# Patient Record
Sex: Male | Born: 1970 | Race: White | Hispanic: No | Marital: Married | Smoking: Former smoker
Health system: Southern US, Community
[De-identification: ages and names within clinical notes are randomized; demographics above are authoritative.]

## PROBLEM LIST (undated history)

## (undated) DIAGNOSIS — I25810 Atherosclerosis of coronary artery bypass graft(s) without angina pectoris: Secondary | ICD-10-CM

## (undated) DIAGNOSIS — I10 Essential (primary) hypertension: Secondary | ICD-10-CM

## (undated) DIAGNOSIS — E119 Type 2 diabetes mellitus without complications: Secondary | ICD-10-CM

## (undated) DIAGNOSIS — E785 Hyperlipidemia, unspecified: Secondary | ICD-10-CM

## (undated) DIAGNOSIS — R748 Abnormal levels of other serum enzymes: Secondary | ICD-10-CM

## (undated) DIAGNOSIS — K219 Gastro-esophageal reflux disease without esophagitis: Secondary | ICD-10-CM

## (undated) HISTORY — DX: Hyperlipidemia, unspecified: E78.5

## (undated) HISTORY — DX: Type 2 diabetes mellitus without complications: E11.9

## (undated) HISTORY — DX: Gastro-esophageal reflux disease without esophagitis: K21.9

## (undated) HISTORY — DX: Essential (primary) hypertension: I10

## (undated) HISTORY — DX: Atherosclerosis of coronary artery bypass graft(s) without angina pectoris: I25.810

## (undated) HISTORY — DX: Abnormal levels of other serum enzymes: R74.8

## (undated) HISTORY — PX: APPENDECTOMY: SHX54

---

## 2004-08-25 ENCOUNTER — Ambulatory Visit: Payer: Self-pay

## 2005-01-28 ENCOUNTER — Ambulatory Visit: Payer: Self-pay | Admitting: Unknown Physician Specialty

## 2005-02-22 ENCOUNTER — Emergency Department: Payer: Self-pay | Admitting: Emergency Medicine

## 2005-08-23 ENCOUNTER — Ambulatory Visit: Payer: Self-pay

## 2006-02-08 ENCOUNTER — Emergency Department: Payer: Self-pay | Admitting: Emergency Medicine

## 2006-02-09 ENCOUNTER — Other Ambulatory Visit: Payer: Self-pay

## 2007-02-28 ENCOUNTER — Inpatient Hospital Stay: Payer: Self-pay | Admitting: Internal Medicine

## 2007-02-28 ENCOUNTER — Other Ambulatory Visit: Payer: Self-pay

## 2007-07-31 DIAGNOSIS — E785 Hyperlipidemia, unspecified: Secondary | ICD-10-CM | POA: Insufficient documentation

## 2007-07-31 HISTORY — PX: CARDIAC CATHETERIZATION: SHX172

## 2007-11-19 ENCOUNTER — Other Ambulatory Visit: Payer: Self-pay

## 2007-11-19 ENCOUNTER — Emergency Department: Payer: Self-pay | Admitting: Emergency Medicine

## 2008-04-20 ENCOUNTER — Emergency Department: Payer: Self-pay | Admitting: Emergency Medicine

## 2008-12-15 ENCOUNTER — Emergency Department: Payer: Self-pay | Admitting: Emergency Medicine

## 2008-12-16 ENCOUNTER — Ambulatory Visit: Payer: Self-pay | Admitting: Emergency Medicine

## 2009-04-29 ENCOUNTER — Emergency Department: Payer: Self-pay | Admitting: Internal Medicine

## 2009-04-30 ENCOUNTER — Emergency Department: Payer: Self-pay | Admitting: Emergency Medicine

## 2009-09-03 ENCOUNTER — Emergency Department: Payer: Self-pay | Admitting: Emergency Medicine

## 2010-01-14 ENCOUNTER — Emergency Department: Payer: Self-pay | Admitting: Emergency Medicine

## 2011-01-10 ENCOUNTER — Emergency Department: Payer: Self-pay | Admitting: Emergency Medicine

## 2011-11-13 ENCOUNTER — Emergency Department: Payer: Self-pay | Admitting: Emergency Medicine

## 2011-11-13 LAB — COMPREHENSIVE METABOLIC PANEL
Albumin: 3.9 g/dL (ref 3.4–5.0)
Anion Gap: 9 (ref 7–16)
BUN: 10 mg/dL (ref 7–18)
Bilirubin,Total: 0.7 mg/dL (ref 0.2–1.0)
Calcium, Total: 8.8 mg/dL (ref 8.5–10.1)
Creatinine: 0.91 mg/dL (ref 0.60–1.30)
EGFR (Non-African Amer.): >60
Glucose: 286 mg/dL — ABNORMAL HIGH (ref 65–99)
Osmolality: 281 (ref 275–301)

## 2011-11-13 LAB — CBC
HCT: 50.4 % (ref 40.0–52.0)
HGB: 17.4 g/dL (ref 13.0–18.0)
MCHC: 34.5 g/dL (ref 32.0–36.0)
Platelet: 190 10*3/uL (ref 150–440)
RBC: 5.98 10*6/uL — ABNORMAL HIGH (ref 4.40–5.90)
RDW: 13.5 % (ref 11.5–14.5)
WBC: 9.2 10*3/uL (ref 3.8–10.6)

## 2011-11-13 LAB — CK TOTAL AND CKMB (NOT AT ARMC): CK, Total: 110 U/L (ref 35–232)

## 2011-11-13 LAB — TROPONIN I: Troponin-I: <0.02 ng/mL

## 2011-11-13 LAB — LIPASE, BLOOD: Lipase: 64 U/L — ABNORMAL LOW (ref 73–393)

## 2011-11-15 ENCOUNTER — Inpatient Hospital Stay: Payer: Self-pay | Admitting: Surgery

## 2011-11-15 LAB — CBC
HCT: 50.3 % (ref 40.0–52.0)
MCV: 84 fL (ref 80–100)
Platelet: 204 10*3/uL (ref 150–440)
RBC: 6 10*6/uL — ABNORMAL HIGH (ref 4.40–5.90)
RDW: 13.5 % (ref 11.5–14.5)
WBC: 10.7 10*3/uL — ABNORMAL HIGH (ref 3.8–10.6)

## 2011-11-15 LAB — COMPREHENSIVE METABOLIC PANEL
Albumin: 3.6 g/dL (ref 3.4–5.0)
Anion Gap: 9 (ref 7–16)
BUN: 9 mg/dL (ref 7–18)
Calcium, Total: 8.8 mg/dL (ref 8.5–10.1)
Co2: 27 mmol/L (ref 21–32)
Creatinine: 0.98 mg/dL (ref 0.60–1.30)
EGFR (African American): >60
EGFR (Non-African Amer.): >60
Glucose: 252 mg/dL — ABNORMAL HIGH (ref 65–99)
Potassium: 3.8 mmol/L (ref 3.5–5.1)
Sodium: 136 mmol/L (ref 136–145)
Total Protein: 8 g/dL (ref 6.4–8.2)

## 2011-11-15 LAB — URINALYSIS, COMPLETE
Bacteria: NONE SEEN
Glucose,UR: >=500 mg/dL (ref 0–75)
Nitrite: NEGATIVE
Ph: 5 (ref 4.5–8.0)
Squamous Epithelial: NONE SEEN
WBC UR: 1 /HPF (ref 0–5)

## 2011-11-15 LAB — LIPID PANEL
Cholesterol: 185 mg/dL (ref 0–200)
Triglycerides: 185 mg/dL (ref 0–200)

## 2011-11-15 LAB — HEMOGLOBIN A1C: Hemoglobin A1C: 11.7 % — ABNORMAL HIGH (ref 4.2–6.3)

## 2011-11-15 LAB — LIPASE, BLOOD: Lipase: 51 U/L — ABNORMAL LOW (ref 73–393)

## 2011-11-16 LAB — HEPATIC FUNCTION PANEL A (ARMC)
Albumin: 3 g/dL — ABNORMAL LOW (ref 3.4–5.0)
Alkaline Phosphatase: 81 U/L (ref 50–136)
Bilirubin, Direct: 0.7 mg/dL — ABNORMAL HIGH (ref 0.00–0.20)
SGOT(AST): 25 U/L (ref 15–37)
Total Protein: 7.2 g/dL (ref 6.4–8.2)

## 2011-11-16 LAB — CBC WITH DIFFERENTIAL/PLATELET
Basophil %: 0.2 %
Basophil %: 0.2 %
Eosinophil #: 0.1 10*3/uL (ref 0.0–0.7)
Eosinophil #: 0 10*3/uL (ref 0.0–0.7)
Eosinophil %: 1.3 %
Eosinophil %: 0.3 %
HCT: 46.6 % (ref 40.0–52.0)
HCT: 49.3 % (ref 40.0–52.0)
HGB: 16.4 g/dL (ref 13.0–18.0)
Lymphocyte #: 2 10*3/uL (ref 1.0–3.6)
Lymphocyte %: 21.8 %
MCH: 28.8 pg (ref 26.0–34.0)
MCH: 28.5 pg (ref 26.0–34.0)
MCHC: 33.7 g/dL (ref 32.0–36.0)
MCHC: 33.4 g/dL (ref 32.0–36.0)
MCV: 85 fL (ref 80–100)
MCV: 85 fL (ref 80–100)
Monocyte #: 1.1 x10 3/mm — ABNORMAL HIGH (ref 0.2–1.0)
Monocyte #: 1.2 x10 3/mm — ABNORMAL HIGH (ref 0.2–1.0)
Monocyte %: 11.9 %
Monocyte %: 9.7 %
Neutrophil %: 64.8 %
Platelet: 198 10*3/uL (ref 150–440)
Platelet: 189 10*3/uL (ref 150–440)
RBC: 5.46 10*6/uL (ref 4.40–5.90)
RBC: 5.78 10*6/uL (ref 4.40–5.90)
RDW: 13.5 % (ref 11.5–14.5)
RDW: 13.5 % (ref 11.5–14.5)
WBC: 9.3 10*3/uL (ref 3.8–10.6)
WBC: 12.1 10*3/uL — ABNORMAL HIGH (ref 3.8–10.6)

## 2011-11-16 LAB — BASIC METABOLIC PANEL
Anion Gap: 12 (ref 7–16)
Chloride: 101 mmol/L (ref 98–107)
Co2: 24 mmol/L (ref 21–32)
EGFR (African American): >60
EGFR (Non-African Amer.): >60
Glucose: 195 mg/dL — ABNORMAL HIGH (ref 65–99)
Potassium: 3.8 mmol/L (ref 3.5–5.1)
Sodium: 137 mmol/L (ref 136–145)

## 2011-11-16 LAB — PROTIME-INR
INR: 1
Prothrombin Time: 13.9 secs (ref 11.5–14.7)

## 2011-11-17 LAB — BASIC METABOLIC PANEL
Calcium, Total: 7.9 mg/dL — ABNORMAL LOW (ref 8.5–10.1)
Co2: 22 mmol/L (ref 21–32)
Creatinine: 0.92 mg/dL (ref 0.60–1.30)
EGFR (African American): >60
EGFR (Non-African Amer.): >60
Glucose: 298 mg/dL — ABNORMAL HIGH (ref 65–99)
Osmolality: 282 (ref 275–301)
Sodium: 135 mmol/L — ABNORMAL LOW (ref 136–145)

## 2011-11-17 LAB — CBC WITH DIFFERENTIAL/PLATELET
Basophil #: 0 10*3/uL (ref 0.0–0.1)
Eosinophil %: 0 %
HCT: 46.4 % (ref 40.0–52.0)
HGB: 15.5 g/dL (ref 13.0–18.0)
Lymphocyte #: 0.9 10*3/uL — ABNORMAL LOW (ref 1.0–3.6)
Lymphocyte %: 7.7 %
MCH: 28.7 pg (ref 26.0–34.0)
MCHC: 33.5 g/dL (ref 32.0–36.0)
Neutrophil %: 82.6 %

## 2011-11-18 LAB — PATHOLOGY REPORT

## 2011-11-19 LAB — CLOSTRIDIUM DIFFICILE BY PCR

## 2011-11-20 LAB — CBC WITH DIFFERENTIAL/PLATELET
Eosinophil #: 0.4 10*3/uL (ref 0.0–0.7)
Eosinophil %: 6.2 %
Lymphocyte %: 39 %
MCH: 28 pg (ref 26.0–34.0)
MCHC: 32.7 g/dL (ref 32.0–36.0)
MCV: 86 fL (ref 80–100)
Neutrophil %: 46.4 %
Platelet: 222 10*3/uL (ref 150–440)
RDW: 13.2 % (ref 11.5–14.5)
WBC: 5.7 10*3/uL (ref 3.8–10.6)

## 2012-05-09 ENCOUNTER — Emergency Department: Payer: Self-pay | Admitting: Emergency Medicine

## 2012-05-09 LAB — CBC
HCT: 49.7 % (ref 40.0–52.0)
HGB: 17.2 g/dL (ref 13.0–18.0)
RBC: 5.93 10*6/uL — ABNORMAL HIGH (ref 4.40–5.90)
RDW: 13.4 % (ref 11.5–14.5)
WBC: 8.6 10*3/uL (ref 3.8–10.6)

## 2012-05-09 LAB — URINALYSIS, COMPLETE
Bacteria: NONE SEEN
Bilirubin,UR: NEGATIVE
Glucose,UR: >=500 mg/dL (ref 0–75)
Ketone: NEGATIVE
Nitrite: NEGATIVE
Protein: NEGATIVE
RBC,UR: 1 /HPF (ref 0–5)
Specific Gravity: 1.043 (ref 1.003–1.030)
Squamous Epithelial: NONE SEEN

## 2012-05-09 LAB — COMPREHENSIVE METABOLIC PANEL
Albumin: 3.7 g/dL (ref 3.4–5.0)
Alkaline Phosphatase: 98 U/L (ref 50–136)
BUN: 10 mg/dL (ref 7–18)
Bilirubin,Total: 0.6 mg/dL (ref 0.2–1.0)
Calcium, Total: 8.5 mg/dL (ref 8.5–10.1)
Creatinine: 1.1 mg/dL (ref 0.60–1.30)
EGFR (African American): >60
EGFR (Non-African Amer.): >60
Glucose: 372 mg/dL — ABNORMAL HIGH (ref 65–99)
Potassium: 3.8 mmol/L (ref 3.5–5.1)
SGOT(AST): 26 U/L (ref 15–37)
Sodium: 134 mmol/L — ABNORMAL LOW (ref 136–145)
Total Protein: 7.7 g/dL (ref 6.4–8.2)

## 2012-05-09 LAB — LIPASE, BLOOD: Lipase: 67 U/L — ABNORMAL LOW (ref 73–393)

## 2012-05-15 LAB — CULTURE, BLOOD (SINGLE)

## 2014-09-28 ENCOUNTER — Emergency Department
Admit: 2014-09-28 | Disposition: A | Discharge status: Home / Self Care | Payer: Self-pay | Admitting: Emergency Medicine

## 2014-09-28 LAB — BASIC METABOLIC PANEL
ANION GAP: 7 (ref 7–16)
BUN: 11 mg/dL
CHLORIDE: 100 mmol/L — AB
CO2: 26 mmol/L
CREATININE: 0.95 mg/dL
Calcium, Total: 8.7 mg/dL — ABNORMAL LOW
EGFR (African American): >60
EGFR (Non-African Amer.): >60
Glucose: 416 mg/dL — ABNORMAL HIGH
POTASSIUM: 3.8 mmol/L
Sodium: 133 mmol/L — ABNORMAL LOW

## 2014-09-28 LAB — TROPONIN I

## 2014-09-28 LAB — D-DIMER(ARMC): D-DIMER: 129 ng/mL

## 2014-09-28 LAB — CBC
HCT: 50.6 % (ref 40.0–52.0)
HGB: 17.6 g/dL (ref 13.0–18.0)
MCH: 29.2 pg (ref 26.0–34.0)
MCHC: 34.8 g/dL (ref 32.0–36.0)
MCV: 84 fL (ref 80–100)
Platelet: 174 10*3/uL (ref 150–440)
RBC: 6.02 10*6/uL — ABNORMAL HIGH (ref 4.40–5.90)
RDW: 13.4 % (ref 11.5–14.5)
WBC: 8.2 10*3/uL (ref 3.8–10.6)

## 2014-10-15 NOTE — Consult Note (Signed)
PATIENT NAME:  Scott [name], Scott [name] MR#:  [id number] DATE OF BIRTH:  30-Jun-1970  DATE OF CONSULTATION:  05/09/2012  REFERRING PHYSICIAN:   CONSULTING PHYSICIAN:  Quentin Ore III, MD  PRIMARY CARE PHYSICIAN: None.    CHIEF COMPLAINT: Abdominal pain, nausea, vomiting.   BRIEF HISTORY: Scott [name] is a 44 year old gentleman seen in the Emergency Room with a two week history of abdominal pain. The pain worsened yesterday with increasing abdominal discomfort primarily in the right lower quadrant. He presented to the Emergency Room tonight for further evaluation. His history is complicated by a similar episode in May of 2013 when he was admitted with similar symptoms. At that time he had had symptoms for approximately two months and had been seen in the Emergency Room once with no significant abnormalities demonstrated but the pain increased and he presented back to the Emergency Room. He underwent laparoscopy which demonstrated infarcted omentum and some jejunal thickening. He underwent an incidental appendectomy and a partial omentectomy performed by Dr. Anda Kraft. He recovered uneventfully and has had no problems in the interim. Currently his CT scan did not demonstrate any significant inflammatory stranding but he did have a temperature of 102. Laboratory values revealed no elevation in his white blood cell count. The surgical service was consulted since his problems appeared similar to his previous abdominal complaints.   PAST MEDICAL HISTORY: 1. Hypertension. 2. Diabetes. 3. Hypercholesterolemia.   PAST SURGICAL HISTORY: He has not had any other surgery than the laparotomy mentioned above. He's recently restarted his medications as he has regained insurance.   MEDICATIONS:  1. Glipizide 5 mg p.o. daily.  2. Hydrochlorothiazide/lisinopril 12.5/20 mg p.o. daily.  3. Metformin 500 mg b.i.d.  4. Promethazine 25 mg p.o. q.6 hours p.r.n.   ALLERGIES: He is allergic to Biaxin, penicillin, and  prednisone.   REVIEW OF SYSTEMS: Unremarkable as outlined with the patient. 10 point review of systems performed without any evidence of abnormality other than noted above.   PHYSICAL EXAMINATION:   GENERAL: Temperature 101, has been as high as 102.9 while in the Emergency Room, pulse rate 100 and regular, blood pressure 149/70. Pain is now 0 and his respiratory rate is normal.   HEENT: No scleral icterus. No pupillary abnormalities. No facial deformity.   NECK: Supple without adenopathy and he has no tenderness. Midline trachea.   CHEST: Clear with no adventitious sounds. Normal pulmonary excursion.   CARDIAC: No murmurs or gallops to my ear. He seems to be in normal sinus rhythm.   ABDOMEN: Soft, mildly tender in the right lower quadrant with some guarding and some mild rebound. He does not have any masses or hernias noted. He has active bowel sounds without evidence of rushes or tinkling. He does not appear to have an acute abdomen.   LOWER EXTREMITIES: No edema. Good distal pulses. Full range of motion.   PSYCHIATRIC: Normal affect and normal orientation.   ASSESSMENT AND PLAN: I have independently reviewed his CT scan. He did not have any significant abnormalities noted. I am not sure what is going on in this patient's presentation. With his fever and abdominal pain I am concerned about a possible intraabdominal infection or a surgical problem. He says he feels better and does not want to be admitted this evening. Will start him on some Cleocin and Cipro since he's pen allergic and see if we can get his symptoms under control. Will also put him on Vicodin for pain.      He is strongly encouraged  to follow-up with us within the next few days if his symptoms do not improve. He is in agreement with this plan and does not want to consider admission at the present time.   ____________________________ Quentin Orealph L. Ely III, MD rle:drc D: 05/09/2012 19:49:32 ET T: 05/10/2012 08:55:27  ET JOB#: [id number]  cc: Quentin Orealph L. Ely III, MD, <Dictator> Quentin OreALPH L ELY MD ELECTRONICALLY SIGNED 05/11/2012 22:30

## 2014-10-20 NOTE — Consult Note (Signed)
PATIENT NAME:  Scott [name], Scott [name] MR#:  [id number] DATE OF BIRTH:  [birth year]  DATE OF CONSULTATION:  11/15/2011  REFERRING PHYSICIAN:   CONSULTING PHYSICIAN:  Pebbles Zeiders A. Egbert GaribaldiBird, MD  REASON FOR CONSULTATION: Right upper quadrant abdominal pain and enteritis.   HISTORY OF PRESENT ILLNESS:  This is a 44 year old white male with a history of diabetes, hypertension, and hyperlipidemia who has been noncompliant with his medications for at least 4 to 5 months. He presents to the Emergency Room earlier today with a two-week history of epigastric abdominal pain, reflux, and pain in his right upper quadrant which has become much worse. He states that he has reflux on a daily basis and takes TUMS and Pepto-Bismol on a near-daily basis. He also admits to occasional ibuprofen use. He was admitted to the medical service. CT scan was obtained after an ultrasound obtained recently was unremarkable for gallstones. On review of the CT scan there are mesenteric inflammatory changes in the right upper quadrant. Gallbladder and liver appear to be normal. No free air is seen. No free fluid. No pneumatosis intestinalis. Both surgery and gastroenterology have been consulted. Gastroenterology plans on doing an upper endoscopy tomorrow. The patient denies any previous abdominal surgery.   ALLERGIES: Biaxin, penicillin, and prednisone.   MEDICATIONS AT PRESENT:  1. Cipro. 2. Flagyl.  3. Protonix.  4. Sliding scale insulin. 5. Morphine  6. Hydralazine. 7. Dilaudid.   PAST MEDICAL HISTORY:  1. Hypertension.  2. Hyperlipidemia.  3. Reflux disease.  4. Hypertension.   PAST SURGICAL HISTORY: None.   SOCIAL HISTORY:  Currently unemployed, uses smokeless tobacco. Does not drink. Does not smoke. Married, two children.   FAMILY HISTORY: Significant for diabetes, hypertension, coronary artery disease, and lung cancer.   PHYSICAL EXAMINATION:  GENERAL: The patient is in no obvious distress. Family is at the bedside.   VITAL  SIGNS: Temperature 98, pulse 73, respiratory rate 20, blood pressure 150/83.   LUNGS: Clear bilaterally.   HEART: Regular rate and rhythm.   ABDOMEN: Soft, mildly tender in the right upper quadrant. No scars. No hernias.   EXTREMITIES: Warm and well perfused.   LABORATORY, DIAGNOSTIC, AND RADIOLOGICAL DATA: Urinalysis negative. White count 10.7, hemoglobin 17.4, hematocrit 50.3, platelet count 204,000. Liver function tests are normal. Total bilirubin 1.1, hemoglobin A1c 11.7, lipase 51. Electrolytes are unremarkable. Glucose 252. Review of CT scan as described above.   IMPRESSION: Abdominal pain and what looks to be regional mesenteritis of the bowel. Differential diagnoses: Infectious enteritis versus regional enteritis and peptic ulcer disease.   PLAN: I agree with empiric antibiotics, pain control, antiemetics, and GI luminal evaluation. At present there is no surgical indication for laparotomy or laparoscopy.   ____________________________ Redge GainerMark A. Egbert GaribaldiBird, MD mab:bjt D: 11/15/2011 04:54:[id number]:09:22 ET T: 11/16/2011 09:52:23 ET JOB#: [id number]  Guss Farruggia A Isamu Trammel MD ELECTRONICALLY SIGNED 11/19/2011 14:20

## 2014-10-20 NOTE — Discharge Summary (Signed)
PATIENT NAME:  Persinger, Keyston MR#:  [id number] DATE OF BIRTH:  [birth year]  DATE OF ADMISSION:  11/15/2011 DATE OF DISCHARGE:  11/20/2011  PRINCIPLE DIAGNOSIS: Infarcted omentum, intraabdominal adhesions.   OTHER DIAGNOSES:  1. Obesity.  2. Hypertension.  3. Dyslipidemia.  4. Type 2 diabetes mellitus.   PRINCIPLE PROCEDURE PERFORMED DURING THIS ADMISSION:  On 11/16/2011: Diagnostic laparoscopy, exploratory laparotomy, omentectomy, enterolysis, incidental appendectomy.   HOSPITAL COURSE: The patient was admitted to the hospital and ultimately taken to the operating room where he underwent the above-mentioned procedure for the above-mentioned principle diagnosis. Postoperatively he recovered well and was tolerating a regular diet. His pain was controlled with oral pain medication and he was discharged home. He was asked to make an appointment to see me in one week for staple removal and to call the office in the interim for any problems. At the conclusion of his surgery, approximately half of his omentum was remaining and it was enough to cover the small intestine and prevent it from adhering to the midline incision.      ____________________________ Claude MangesWilliam F. Laurin Paulo, MD wfm:bjt D: 11/26/2011 20:35:32 ET T: 11/27/2011 13:29:19 ET JOB#: [id number]  cc: Claude MangesWilliam F. Constantina Laseter, MD, <Dictator> Claude MangesWILLIAM F Grant Swager MD ELECTRONICALLY SIGNED 11/29/2011 21:45

## 2014-10-20 NOTE — Consult Note (Signed)
Brief Consult Note: Diagnosis: abdominal pain, ddx regional enteritis, viral enteritis (less likely), PUD?Marland Kitchen.   Patient was seen by consultant.   Consult note dictated.   Recommend to proceed with surgery or procedure.   Comments: at present no plans for laparoscopy, agree with luminal evaluation in am.  Electronic Signatures: Natale LayBird, Scherry Laverne (MD)  (Signed 20-May-13 20:40)  Authored: Brief Consult Note   Last Updated: 20-May-13 20:40 by Natale LayBird, Mitzy Naron (MD)

## 2014-10-20 NOTE — Consult Note (Signed)
Chief Complaint:   Subjective/Chief Complaint sedated, will arouse, but no interaction.  no apparent discomfort.   VITAL SIGNS/ANCILLARY NOTES: **Vital Signs.:   22-May-13 10:05   Vital Signs Type Routine   Temperature Temperature (F) 97.9   Celsius 36.6   Temperature Source oral   Pulse Pulse 75   Pulse source per Dinamap   Respirations Respirations 14   Systolic BP Systolic BP 967   Diastolic BP (mmHg) Diastolic BP (mmHg) 91   Mean BP 110   BP Source Dinamap   Pulse Ox % Pulse Ox % 93   Pulse Ox Activity Level  At rest   Oxygen Delivery 2L   Brief Assessment:   Cardiac Regular    Respiratory clear BS    Gastrointestinal details normal moderate distension, bowel sounds few/distant.  relatively softer today to palpation     Blood Glucose:  22-May-13 07:26    POCT Blood Glucose 252  Routine Chem:  22-May-13 09:07    Glucose, Serum 298   BUN 16   Creatinine (comp) 0.92   Sodium, Serum 135   Potassium, Serum 4.3   Chloride, Serum 102   CO2, Serum 22   Calcium (Total), Serum 7.9   Osmolality (calc) 282   eGFR (African American) >60   eGFR (Non-African American) >60   Anion Gap 11  Routine Hem:  22-May-13 09:07    WBC (CBC) 12.0   RBC (CBC) 5.42   Hemoglobin (CBC) 15.5   Hematocrit (CBC) 46.4   Platelet Count (CBC) 222   MCV 86   MCH 28.7   MCHC 33.5   RDW 13.2   Neutrophil % 82.6   Lymphocyte % 7.7   Monocyte % 9.5   Eosinophil % 0.0   Basophil % 0.2   Neutrophil # 9.9   Lymphocyte # 0.9   Monocyte # 1.1   Eosinophil # 0.0   Basophil # 0.0  Blood Glucose:  22-May-13 11:19    POCT Blood Glucose 259   Assessment/Plan:  Assessment/Plan:   Assessment 1) abdominal pain-s/p laparotomy with finding of infarcted omentum.  stable, sedated.    Plan 1) continue ulcer prophylaxis, following. no new recs.  will check h./ pylori serology.   Electronic Signatures: Loistine Simas (MD)  (Signed [phone] 15:49)  Authored: Chief Complaint, VITAL  SIGNS/ANCILLARY NOTES, Brief Assessment, Lab Results, Assessment/Plan   Last Updated: 22-May-13 15:49 by Loistine Simas (MD)

## 2014-10-20 NOTE — Consult Note (Signed)
Chief Complaint:   Subjective/Chief Complaint easily awakened and alert.  much less abd pain, minimal flatus, tolerating ice chips. feels somewhat less distended.   VITAL SIGNS/ANCILLARY NOTES: **Vital Signs.:   23-May-13 13:34   Vital Signs Type Routine   Temperature Temperature (F) 98.2   Celsius 36.7   Temperature Source oral   Pulse Pulse 89   Pulse source per Dinamap   Respirations Respirations 20   Systolic BP Systolic BP 136   Diastolic BP (mmHg) Diastolic BP (mmHg) 81   Mean BP 99   BP Source Dinamap   Pulse Ox % Pulse Ox % 98   Pulse Ox Activity Level  At rest   Oxygen Delivery Room Air/ 21 %   Brief Assessment:   Cardiac Regular    Respiratory clear BS    Gastrointestinal details normal mld distension, distant few bowel sounds.  appropriately tender.   Blood Glucose:  23-May-13 07:34    POCT Blood Glucose 202    11:56    POCT Blood Glucose 204   Assessment/Plan:  Assessment/Plan:   Assessment 1) s/p ex laparotomy with finding of omental infarction. doing well.    Plan 1) continue current, continue ulcer prophylaxis. will sign off reconsult if needed.   Electronic Signatures: Barnetta ChapelSkulskie, Kaydynce Pat (MD)  (Signed [phone]-May-13 17:17)  Authored: Chief Complaint, VITAL SIGNS/ANCILLARY NOTES, Brief Assessment, Lab Results, Assessment/Plan   Last Updated: 23-May-13 17:17 by Barnetta ChapelSkulskie, Golda Zavalza (MD)

## 2014-10-20 NOTE — Consult Note (Signed)
Chief Complaint:   Subjective/Chief Complaint Please see full GI consult.  Patietn seen and examined, chart reviewed. Patient presenting with 2 week  h/o epigastric pain, then readiating into the ruqand lowere epigastrum.  Pain intensified over the last several days.  Night awakening with pain.  Denies black stools or hematemesis.  Positive occasional nsaid use, daily H2RA and tums for Heartburn.  Hemodynamically stable.  Possible enteritis suggested on CT noted in the right upper quadrant.   Now on ppi. Will plan egd for tomorrow. clears until mn. I have discussed the risks benefits and complications of egd to include not limited to bleeding infection perforationa nd sedationa dnhw wishes to proceed.   VITAL SIGNS/ANCILLARY NOTES: **Vital Signs.:   20-May-13 15:55   Vital Signs Type Admission   Temperature Temperature (F) 99.5   Celsius 37.5   Pulse Pulse 91   Respirations Respirations 18   Systolic BP Systolic BP 145   Diastolic BP (mmHg) Diastolic BP (mmHg) 90   Mean BP 108   BP Source Dinamap   Pulse Ox % Pulse Ox % 93   Pulse Ox Activity Level  At rest   Oxygen Delivery Room Air/ 21 %   Electronic Signatures: Barnetta ChapelSkulskie, Martin (MD)  (Signed 20-May-13 18:42)  Authored: Chief Complaint, VITAL SIGNS/ANCILLARY NOTES   Last Updated: 20-May-13 18:42 by Barnetta ChapelSkulskie, Martin (MD)

## 2014-10-20 NOTE — H&P (Signed)
PATIENT NAME:  Scott [name], Scott [name] MR#:  [id number] DATE OF BIRTH:  [birth year]  DATE OF ADMISSION:  11/15/2011  PRIMARY CARE PHYSICIAN: None, formerly [facility].   CHIEF COMPLAINT: Excruciating right lower quadrant abdominal pain.   HISTORY OF PRESENT ILLNESS: The patient is a 44 year old male who presents with the above complaint. The patient was seen on Saturday for excruciating right lower quadrant pain. At that time he had ultrasound of the gallbladder done which essentially was normal. He was sent home without further work-up. He comes back today with ongoing excruciating right lower quadrant pain. On a scale of 1 to 10, he says it's 7 or 8 out of 10. No alleviating factors. Any kind of movement aggravates the pain. Food does not aggravate the pain. Two weeks prior to this episode he had severe epigastric pain as well which he feels has now radiated down actually to the right lower quadrant. He denies any melena, hematochezia, or hematuria. He uses TUMS very frequently for what he says is terrible heartburn. He has not been on any of his medications including medications for diabetes, hypertension, or hyperlipidemia for the past five months.   REVIEW OF SYSTEMS: CONSTITUTIONAL: No fever, fatigue, weakness. EYES: No blurred or double vision, glaucoma, cataracts. ENT: No ear pain, hearing loss, seasonal allergies. RESPIRATORY: No cough, wheezing, hemoptysis. CARDIOVASCULAR: No chest pain, orthopnea, palpitations, syncope, edema, arrhythmia, dyspnea on exertion. GI: Positive nausea. No vomiting. No diarrhea. Positive right lower quadrant abdominal pain. No history of melena or ulcers. Positive history of heavy heartburn. GU: No dysuria or hematuria. ENDOCRINE: No polyuria or polydipsia. HEME/LYMPH: No easy bruising or anemia. SKIN: No rash or lesions. MUSCULOSKELETAL: No limited activity. NEUROLOGIC: No history of CVA, TIA, seizures, or ataxia. PSYCH: No history of anxiety or depression.   PAST  MEDICAL HISTORY:  1. Hypertension.  2. Hyperlipidemia.  3. Diabetes.   MEDICATIONS: The patient has not been on any medications for the past five months. He was on lisinopril, HCTZ, metformin, and a cholesterol pill which he does not know the name of.   ALLERGIES: Biaxin, penicillin, and prednisone causes rash.   SOCIAL HISTORY: He uses smokeless tobacco. No alcohol or IV drug use.   FAMILY HISTORY: Diabetes, hypertension, coronary artery disease, lung cancer.   PAST SURGICAL HISTORY: None.   PHYSICAL EXAMINATION:  VITAL SIGNS: Temperature 98.6, pulse 90, respirations 20, blood pressure 154/98, 97% on room air.   GENERAL: The patient is alert, oriented. He is in mild distress.   HEENT: Head is atraumatic. Pupils are round, reactive. Sclerae anicteric. Mucous membranes are moist. Oropharynx is clear.   NECK: Short, supple. No obvious thyroid enlargement.   CARDIOVASCULAR: Regular rate and rhythm. No murmurs, gallops, or rubs. PMI is not displaced.   LUNGS: Clear to auscultation bilaterally without crackles, rales, rhonchi, or wheezing.   ABDOMEN: Severe tenderness and some mild rebound of the right lower quadrant pain even with distracting the patient.   EXTREMITIES: No clubbing, cyanosis, or edema.   NEUROLOGIC: Cranial nerves II through XII are intact. No focal deficits.   SKIN: Without rash or lesions.   LABORATORY, DIAGNOSTIC, AND RADIOLOGICAL DATA: Urinalysis shows no LCE or nitrites. He does have 100 protein. Sodium 136, potassium 3.8, chloride 100, bicarb 27, BUN 9, creatinine 0.98, glucose 252, calcium 8.8, bilirubin 1.1, alkaline phosphatase 108, ALT 59, AST 25, total protein 8.0, albumin 3.6. Lipase 51.   Ultrasound on 11/13/2011 of the abdomen showed normal gallbladder.   CT of the abdomen and  pelvis with contrast area of inflammatory stranding within the anterior to anterolateral mesenteric fat with some abnormal appearance of adjacent loops of small bowel with  luminal distention and slight thickening of the wall concerning for enteritis. No bowel obstruction. No abscess or perforation. No abdominal wall defect.   ASSESSMENT AND PLAN: This is a 44 year old male who presented with epigastric pain about two weeks ago and now with excruciating right lower quadrant pain.  1. Abdominal pain. I'd like to rule out either peptic ulcer disease as the etiology of this excruciating pain versus surgical abdomen such as appendicitis. The CT does mention enterocolitis, however, the patient does not have any symptoms such as diarrhea, melena, or hematochezia. No fevers or chills were also noted. However, due to the excruciating abdominal pain I will empirically treat with ciprofloxacin and Flagyl for any gut pathology. Will also consult Surgery and GI. I have started Protonix IV q.12 hours for his symptoms which could be related to severe peptic ulcer disease. We will continue with supportive care for now until surgical evaluation.  2. History of diabetes. The patient is not on any medications. I have placed him on sliding scale insulin. I also ordered a hemoglobin A1c. He does have follow-up already scheduled with [facility] on June 3rd at 1 p.m.  3. Hyperlipidemia. Since the patient has been off of medications, I went ahead and checked his lipid panel. This could be addressed during this hospitalization or as an outpatient.  4. Hypertension. The patient will likely need to be back on medications prior to discharge. For now I've just written for some p.r.n. hydralazine. Monitor his blood pressure every four hours.  5. Smokeless tobacco. The patient was counseled on trying to stop smokeless tobacco. This is still harmful to him. He at this point is contemplating the idea.   TIME SPENT: 55 minutes.   ____________________________ Janyth Contes. Juliene Pina, MD spm:drc D: 11/15/2011 14:11:21 ET T: 11/15/2011 14:46:56 ET JOB#: [id number]  cc: Trini Christiansen P. Juliene Pina, MD, <Dictator> [facility] P Hulet Ehrmann MD ELECTRONICALLY SIGNED 11/15/2011 15:29

## 2014-10-20 NOTE — Consult Note (Signed)
Brief Consult Note: Diagnosis: abdomimal pain.   Patient was seen by consultant.   Consult note dictated.   Comments: Appreciate consult for 44 y/o caucasian man with history of DM, htn, hl, GERD for abdominal pain. Was going to have EGD a few years ago for GERD complaints, but did not.  Has been off all meds for the last 1557m, except for otc zantac, tums, and ibuprofen. Dips snuff daily. 2L carbonated beverages daily. Reports onset of epigastric pain 2w ago that has spread and moved down the right side of his abdomen. Associated with heartburn, indigestion, acid reflux, intermittent nausea and vomiting. dec. appetite, generalized bloating, and burping. Denies coffeeground emesis, diarrhea, black/tarry/bloody/mucousy stools. Has a soft brown bm daily. States he is currently consuming about 2 bottles of Tums monthly. Uses 800mg  Ibuprofen on a fairly regular basis for various aches/pains.  Points to the mid-right abdomen as location of most intense pain. States that eating temporarily improves pain. Feels better when lying on right side. Movement, standing, touch, exhalation make it worse. No accutane use as teen. No family history of IBD or autoimmune dz. Hgb stable. Mild leukocytosis. Low grade temp. CT with inflammation to small bowel. No CT evidence of perforation. Cipro/Flagyl/Pantoprazole ordered IV. Heme negative on exam Impression and plan: Concerning for PUD. Agree with IV Pantoprazole 40mg  iv bid. Would follow CBC. Will change diet to clears without red/carbonation.  Plan for EGD tomorrow..  Electronic Signatures: Vevelyn PatLondon, Shelvy Heckert H (NP)  (Signed 20-May-13 17:20)  Authored: Brief Consult Note   Last Updated: 20-May-13 17:20 by Keturah BarreLondon, Aiko Belko H (NP)

## 2014-10-20 NOTE — Consult Note (Signed)
Chief Complaint:   Subjective/Chief Complaint patient brought to endoscopy for egd.  change of physical exam noted, much more pain, abdominal firmness.   VITAL SIGNS/ANCILLARY NOTES: **Vital Signs.:   21-May-13 09:34   Vital Signs Type Q 4hr   Temperature Temperature (F) 99.7   Celsius 37.6   Temperature Source oral   Pulse Pulse 93   Pulse source per Dinamap   Respirations Respirations 18   Systolic BP Systolic BP 124   Diastolic BP (mmHg) Diastolic BP (mmHg) 76   Mean BP 92   BP Source Dinamap   Pulse Ox % Pulse Ox % 92   Pulse Ox Activity Level  At rest   Oxygen Delivery Room Air/ 21 %   Brief Assessment:   Cardiac Regular    Respiratory clear BS    Gastrointestinal details normal distended, marked tenderness epigastrum and ruq.  mild rebound./peritoneal signs.   Routine Hem:  21-May-13 04:34    WBC (CBC) 9.3   RBC (CBC) 5.46   Hemoglobin (CBC) 15.7   Hematocrit (CBC) 46.6   Platelet Count (CBC) 198   MCV 85   MCH 28.8   MCHC 33.7   RDW 13.5   Neutrophil % 64.8   Lymphocyte % 21.8   Monocyte % 11.9   Eosinophil % 1.3   Basophil % 0.2   Neutrophil # 6.0   Lymphocyte # 2.0   Monocyte # 1.1   Eosinophil # 0.1   Basophil # 0.0  Blood Glucose:  21-May-13 07:22    POCT Blood Glucose 183   Radiology Results: XRay:    21-May-13 13:33, KUB - Kidney Ureter Bladder   KUB - Kidney Ureter Bladder    REASON FOR EXAM:    Severe abd. pain  COMMENTS:   Bedside (portable):Y    PROCEDURE: DXR - DXR KIDNEY URETER BLADDER  - Nov 16 2011  1:33PM     RESULT: Contrast is present predominantly in the colon from the patient's   recent CT of the abdomen and pelvis. No abnormal colonic distention is   present. There is some air within loops of small bowel in the left   midabdomen without pathologic or abnormal distention. A small amount of   air is seen in the stomach. There is a rounded metallic density in the   patient's left pocket projecting over the supraacetabular  region. The   bony structures appear unremarkable.    IMPRESSION:  Air within loops of small bowel without evidence of   obstruction.  Thank you for the opportunity to contribute to thecare of your patient.     Dictation Site: 2          Verified By: Elveria RoyalsGEOFFREY H. BROWNE, M.D., MD    21-May-13 14:30, Abdomen Complete 3 or more views of abd   Abdomen Complete 3 or more views of abd    REASON FOR EXAM:    epigasatric and ruq pain, rule out free air.  COMMENTS:       PROCEDURE: DXR - DXR ABDOMEN COMPLETE  - Nov 16 2011  2:30PM     RESULT: Supine, erect and left lateral decubitus views the abdomen are   obtained. There is no previoussimilar study for comparison.    No free air is seen on the decubitus or erect views. There is a small   amount of air in the stomach. Air and contrast is seen in the colon along   with some fecal material. There is some air seen in  loops of small bowel   in the left upper to mid abdomen without obstruction. The lung bases are   clear.    IMPRESSION:   1. No definite evidence of bowel obstruction or perforation.    Dictation Site: 2          Verified By: Elveria Royals, M.D., MD   Assessment/Plan:  Assessment/Plan:   Assessment 1) abdominal pain-worsening exam, probable peritoneal signs.  probable early acute abd.  Multiple abd films obtained to r/o free air- negative for this.  Case reviewed with Dr Anda Kraft.  Decision to hold egd for now, he isll take patient to OR for diagnostic laparoscopy.  Possible egd depending on results/findings.    Plan as noted above.   Electronic Signatures: Barnetta Chapel (MD)  (Signed 21-May-13 15:45)  Authored: Chief Complaint, VITAL SIGNS/ANCILLARY NOTES, Brief Assessment, Lab Results, Radiology Results, Assessment/Plan   Last Updated: 21-May-13 15:45 by Barnetta Chapel (MD)

## 2014-10-20 NOTE — Op Note (Signed)
PATIENT NAME:  Scott [name], Scott [name] MR#:  [id number] DATE OF BIRTH:  1970/12/22  DATE OF PROCEDURE:  11/16/2011  PREOPERATIVE DIAGNOSIS: Peritonitis.   POSTOPERATIVE DIAGNOSIS: Infarcted omentum, intraabdominal adhesions.  PROCEDURES: 1. Diagnostic laparoscopy.  2. Exploratory laparotomy.  3. Omentectomy.  4. Enterolysis.  5. Incidental appendectomy.   SURGEON: Consuela Mimes, M.D.   ANESTHESIA: General.  PROCEDURE IN DETAIL: The patient was placed supine on the operating room table and prepped and draped in the usual sterile fashion. A 15 mmHg CO2 pneumoperitoneum was created via a Veress needle, in the infraumbilical position, and this was replaced with a 5 mm trocar and a 25 degree angled scope. The laparoscopy showed adherent inflamed small intestine to the right midabdomen and an area of inflammation in the right mid abdomen that was adherent to the anterior abdominal wall. It was felt that this could not be handled laparoscopically and therefore the procedure was converted to an exploratory laparotomy.   A limited midline, mostly upper incision was made in the skin. This was carried down through the subcutaneous tissue and the linea alba with electrocautery. The peritoneum was entered carefully. There was adherent phlegmon to the right side of the anterior abdominal wall and this was peeled away bluntly. Once this was delivered into the wound, it was apparent that this was a large section of infarcted omentum. Later in the procedure, I felt like this was likely due to a volvulus or twist in the omentum such that the under surface of the omentum was adherent to the anterior abdominal wall. There was an interloop adhesion in the proximal jejunum near this area and that was taken down sharply with the Metzenbaum scissors. The jejunum there was thickened and dilated, but the mesentery was normal and there was no real jejunal pathology. The entire small intestine was run and other than the fact  that from the ligament of Treitz the first loop of jejunum was adherent to the undersurface of the transverse mesocolon all the way over to the right side of the abdomen there was normal rotation. The small bowel mesentery was normally fixed in the retroperitoneum across a broad surface that ended in the right lower quadrant. There were no abnormalities in the intestine or the mesentery itself. The appendix was quite long and was adherent to the pelvis and these adhesions were taken down with electrocautery and an incidental appendectomy was performed by taking down the mesoappendix with the Harmonic scalpel and performing the appendectomy with a GIA stapling device just where the appendix met the cecal base. Attention was turned back to the infarcted segment of omentum and this omentectomy was performed with the Harmonic scalpel. All of the small intestine was replaced in its anatomic position and two sheets of Seprafilm were placed between the intestine and the transverse colon and remaining omentum and the omentum was draped over top of the small intestine and the linea alba was closed with a running #1 PDS suture. The skin was reapproximated with a skin stapling device and a sterile dressing was applied. The patient tolerated the procedure well. There were no complications. ____________________________ Consuela Mimes, MD wfm:slb D: 11/16/2011 18:18:00 ET T: 11/17/2011 09:04:29 ET JOB#: [id number]  cc: Consuela Mimes, MD, <Dictator> Consuela Mimes MD ELECTRONICALLY SIGNED 11/17/2011 9:23

## 2014-10-20 NOTE — H&P (Signed)
PATIENT NAME:  Koob, Giovanne MR#:  [id number] DATE OF BIRTH:  July 26, 1970  DATE OF ADMISSION:  11/15/2011  ADDENDUM:  He is on glipizide 5 mg daily, lisinopril/HCTZ 20/12.5 mg daily, metformin 1000 mg b.i.d.  He has been out of his medications for five months. These are all $4 prescriptions, so I have written a prescription for these medications. If they need to be changed at discharge, please make sure that you review this with the patient's wife.   ____________________________ Janyth ContesSital P. Juliene PinaMody, MD spm:cbb D: 11/15/2011 14:42:53 ET T: 11/15/2011 15:03:38 ET JOB#: [id number]  cc: Dalasia Predmore P. Juliene PinaMody, MD, <Dictator> [facility] [place] P Avielle Imbert MD ELECTRONICALLY SIGNED 11/15/2011 15:29

## 2014-10-20 NOTE — Consult Note (Signed)
PATIENT NAME:  Scott [name], Scott [name] MR#:  [id number] DATE OF BIRTH:  11/23/1970  DATE OF CONSULTATION:  11/15/2011  REFERRING PHYSICIAN:   CONSULTING PHYSICIAN:  Keturah Barre, NP  PRIMARY CARE PHYSICIAN:  None.  [facility] [place]   HISTORY OF PRESENT ILLNESS: Scott [name] is a 44 year old Caucasian male with a history of hypertension, hyperlipidemia, diabetes, and gastroesophageal reflux disease who was admitted for right-sided abdominal pain. GI was consulted at the request of Dr. Juliene Pina for evaluation of such. Evidently the patient has had some ongoing years of reflux. He was going to have EGD a few years ago by Dr. Mechele Collin but did not have this procedure. He has been off of all his medications for the last five months except for over-the-counter Zantac, TUMS, and ibuprofen. He does report that he dips snuff daily and consumes 2 liters of carbonated beverage daily. Today he reports to me onset of epigastric pain that started two weeks ago, spread and moved down to the right side of his abdomen, associated with heartburn, indigestion, acid reflux, intermittent nausea, vomiting, decreased appetite, generalized bloating, and burping. He denies coffee-ground emesis, diarrhea, black tarry, bloody, mucosy stools. He says he has a soft, brown bowel movement daily. He states he is consuming about two bottles of TUMS monthly. He uses 800 mg of ibuprofen on a fairly regular basis for various aches and pains, points to the mid right abdomen lateral to the umbilicus as location of most intense pain.  He states that eating temporarily improves the pain. He feels better when he lies on the right side. Movement, standing, touch, exhalation make it worse. No accutane use as teen/youngadult.  No family history of IBD or autoimmune disease. His hemoglobin is stable. He has mild leukocytosis and low-grade temperature. CT with inflammation to small bowel, however no evidence of perforation on CT. Cipro, Flagyl, and  pantoprazole have been ordered IV.  REVIEW OF SYSTEMS: Ten-point review unremarkable other than what is noted above.   PAST MEDICAL HISTORY:  1. Hypertension.  2. Hyperlipidemia.  3. Diabetes. 4. Gastroesophageal reflux disease.   PAST SURGICAL HISTORY: Denies surgical history.   MEDICATIONS: As indicated, none for the last five months except the over-the-counter TUMS, ibuprofen, and Zantac. Prior to that he was on lisinopril, hydrochlorothiazide, metformin, and a cholesterol pill.   ALLERGIES: Biaxin, penicillin, prednisone.   SOCIAL HISTORY: Dips snuff, no smoking cigarettes, did quit this a few years ago. No alcohol or IVDU. Lives with wife.   FAMILY HISTORY: Pertinent for diabetes, hypertension, coronary artery disease, lung cancer, questionable for peptic ulcer disease. Also the patient reports his father had a colon growth. He is unsure whether this is a polyp or malignancy. There is no liver disease or IBD history. He has a distant cousin with multiple sclerosis.   LABS/STUDIES: Most recent lab work: Glucose 252, BUN 9, creatinine 0.98, sodium 136, potassium 3.8, chloride 100, GFR greater than 60, LDL 112, calcium 8.8. Lipase 51, A1c 11.7,  serum protein 8, albumin 3.6, total bilirubin 1.1, ALP 108, AST 25, ALT 59. WBC 10.7, hemoglobin 17.4, hematocrit 50, platelets 204. Red cells are of normal size. Abdominal ultrasound on 05/18 demonstrated a fatty liver, normal gallbladder, common bile duct. Abdominopelvic CT with contrast did demonstrate some areas concerning for enteritis in the small bowel. No bowel obstruction, no evidence of perforation or abscess. No abdominal wall defect evident. Did demonstrate fatty infiltration of the liver.   PHYSICAL EXAMINATION:  VITAL SIGNS: Most recent vital signs: Temperature  99.5, pulse 91, respiratory rate 18, blood pressure 145/90, oxygen saturation 93% on room air.   GENERAL: Alert, oriented Caucasian male lying in bed in no acute distress.    HEENT: Normocephalic, atraumatic. No redness, drainage, or inflammation to the eyes or the nares. Oral mucous membranes are pink and moist.   NECK: Supple. No thyromegaly, adenopathy, or JVD.   RESPIRATORY: Respirations eupneic. Lungs clear to auscultation bilaterally.   CARDIOVASCULAR: Regular rate and rhythm, S1, S2. No murmurs, rubs, or gallops. Peripheral pulses are 2+. There is no appreciable edema.   ABDOMEN: Nondistended. Bowel sounds times four. Definite tenderness to the epigastrium, right upper quadrant, right middle quadrant, mild tenderness to the right lower quadrant. There is mild guarding. There is no rigidity.  Abdomen is soft. There is no hepatosplenomegaly, hernias,  rebound tenderness, or peritoneal signs.   GENITOURINARY: Deferred.   RECTAL: Rectal mucosa was heme negative. No obvious abnormalities.   EXTREMITIES: Moves all extremities well times four. Strength 5/5. Sensation intact. No clubbing, cyanosis, or edema.   SKIN: Warm, dry, pink, without erythema, lesion or rash.   NEUROLOGIC: Alert and oriented times three. Cranial nerves II through XII grossly intact. Speech clear. No facial droop.   PSYCHIATRIC: Cooperative, pleasant, logical thought, mood stable.   IMPRESSION AND PLAN: Concerning for peptic ulcer disease. Agree with IV pantoprazole 40 mg b.i.d and anitbiotics. Would follow CBC. We will change diet to clear liquids without redness or carbonation. Plan for EGD tomorrow.   These services were provided by Vevelyn Pathristiane Kassadie Pancake, MSN, [facility] in collaboration with Barnetta ChapelMartin Skulskie, M.D. with whom I have discussed this patient in full.   ____________________________ Keturah Barrehristiane H. Kia Stavros, NP chl:bjt D: 11/16/2011 08:31:54 ET T: 11/16/2011 09:19:17 ET JOB#: [id number]  cc: Keturah Barrehristiane H. Kennede Lusk, NP, <Dictator> Eustaquio MaizeHRISTIANE H Omran Keelin FNP ELECTRONICALLY SIGNED 11/17/2011 15:07

## 2014-12-05 ENCOUNTER — Encounter (INDEPENDENT_AMBULATORY_CARE_PROVIDER_SITE_OTHER): Payer: Self-pay

## 2014-12-05 ENCOUNTER — Encounter: Payer: Self-pay | Admitting: Family Medicine

## 2014-12-05 ENCOUNTER — Ambulatory Visit (INDEPENDENT_AMBULATORY_CARE_PROVIDER_SITE_OTHER): Payer: BLUE CROSS/BLUE SHIELD | Admitting: Family Medicine

## 2014-12-05 VITALS — BP 124/78 | HR 90 | Temp 98.4°F | Resp 18 | Ht 70.0 in | Wt 221.8 lb

## 2014-12-05 DIAGNOSIS — E119 Type 2 diabetes mellitus without complications: Secondary | ICD-10-CM | POA: Insufficient documentation

## 2014-12-05 DIAGNOSIS — G909 Disorder of the autonomic nervous system, unspecified: Secondary | ICD-10-CM | POA: Insufficient documentation

## 2014-12-05 DIAGNOSIS — E669 Obesity, unspecified: Secondary | ICD-10-CM | POA: Insufficient documentation

## 2014-12-05 DIAGNOSIS — I2510 Atherosclerotic heart disease of native coronary artery without angina pectoris: Secondary | ICD-10-CM | POA: Insufficient documentation

## 2014-12-05 DIAGNOSIS — IMO0002 Reserved for concepts with insufficient information to code with codable children: Secondary | ICD-10-CM

## 2014-12-05 DIAGNOSIS — K219 Gastro-esophageal reflux disease without esophagitis: Secondary | ICD-10-CM | POA: Insufficient documentation

## 2014-12-05 DIAGNOSIS — E1165 Type 2 diabetes mellitus with hyperglycemia: Secondary | ICD-10-CM

## 2014-12-05 LAB — POCT UA - MICROALBUMIN: MICROALBUMIN (UR) POC: 100 mg/L

## 2014-12-05 LAB — POCT GLYCOSYLATED HEMOGLOBIN (HGB A1C): Hemoglobin A1C: 12.2

## 2014-12-05 MED ORDER — GLIPIZIDE 10 MG PO TABS: 10.0000 mg | ORAL_TABLET | Freq: Every day | ORAL | Status: DC

## 2014-12-05 MED ORDER — INSULIN PEN NEEDLE 32G X 4 MM MISC: 1.0000 | Freq: Every day | Status: DC

## 2014-12-05 NOTE — Progress Notes (Signed)
Name: Scott [name]   MRN: [id number]    DOB: 25-Nov-1970   Date:12/05/2014       Progress Note  Subjective  Chief Complaint  Chief Complaint  Patient presents with  . Diabetes    1 month follow up. Patient needs refill of needles for insulin    HPI  Patient is here for routine follow up of Diabetes Type II.  Current diabetes medication regimen includes Metformin and Tresiba. Patient is not taking medications as instructed. Could not tolerate GI side effects from Metformin. [place] and can not deal with the inconvenience of having to go the toilet all the time while on Metformin. He was able to pick up [place] but he never got an Rx for the pen needles so wasn't able to start it. Overall the patient feels that their blood glucose is not well controlled. Checking blood glucose 0 times a day/week consistently/inconsistently. Fasting glucose range unknown. Meal time glucose range unknown. Lowest glucose result unknown.   Related symptoms? none Any hypoglycemic symptoms?  Never  Last Diabetic Eye Exam? Years Checking feet daily? No    Patient Active Problem List   Diagnosis Date Noted  . CAD in native artery 12/05/2014  . Gastro-esophageal reflux disease without esophagitis 12/05/2014  . Adiposity 12/05/2014  . Disorder of peripheral nervous system 12/05/2014  . Diabetes 12/05/2014  . Abnormal liver enzymes 07/31/2007  . HLD (hyperlipidemia) 07/31/2007  . BP (high blood pressure) 07/31/2007  . Diabetes mellitus type 2, uncontrolled 07/31/2007    History  Substance Use Topics  . Smoking status: Current Every Day Smoker    Types: Cigarettes, E-cigarettes  . Smokeless tobacco: Not on file  . Alcohol Use: No     Current outpatient prescriptions:  .  cyclobenzaprine (FLEXERIL) 5 MG tablet, , Disp: , Rfl:  .  HYDROcodone-acetaminophen (NORCO/VICODIN) 5-325 MG per tablet, , Disp: , Rfl:  .  Insulin Degludec (TRESIBA FLEXTOUCH) 200 UNIT/ML SOPN,  Inject into the skin., Disp: , Rfl:  .  omeprazole (PRILOSEC) 40 MG capsule, Take by mouth., Disp: , Rfl:  .  pravastatin (PRAVACHOL) 10 MG tablet, Take by mouth., Disp: , Rfl:   No past surgical history on file.  No family history on file.  Allergies  Allergen Reactions  . Biaxin  [Clarithromycin]     CAUSE RASH  . Penicillins     CAUSE RASH  . Prednisone     FEEL DIZZY     Review of Systems  Ten systems reviewed and is negative except as mentioned in HPI.   Objective  BP 124/78 mmHg  Pulse 90  Temp(Src) 98.4 F (36.9 C) (Oral)  Resp 18  Ht 5' 10"  (1.778 m)  Wt 221 lb 12.8 oz (100.608 kg)  BMI 31.83 kg/m2  SpO2 96%  Physical Exam  Constitutional: Patient appears well-developed and well-nourished. In no distress.  HEENT:  - Head: Normocephalic and atraumatic.  - Ears: Bilateral TMs gray, no erythema or effusion - Nose: Nasal mucosa moist - Mouth/Throat: Oropharynx is clear and moist. No tonsillar hypertrophy or erythema. No post nasal drainage.  - Eyes: Conjunctivae clear, EOM movements normal. PERRLA. No scleral icterus.  Neck: Normal range of motion. Neck supple. No JVD present. No thyromegaly present.  Cardiovascular: Normal rate, regular rhythm and normal heart sounds.  No murmur heard.  Pulmonary/Chest: Effort normal and breath sounds normal. No respiratory distress. Musculoskeletal: Normal range of motion bilateral UE and LE, no joint  effusions. Peripheral vascular: Bilateral LE no edema. Neurological: CN II-XII grossly intact with no focal deficits. Alert and oriented to person, place, and time. Coordination, balance, strength, speech and gait are normal.  Skin: Skin is warm and dry. No rash noted. No erythema.  Psychiatric: Patient has a normal mood and affect. Behavior is normal in office today. Judgment and thought content normal in office today.   Recent Results (from the past 2160 hour(s))  Troponin I     Status: None   Collection Time: 09/28/14   2:21 AM  Result Value Ref Range   Troponin-I <0.03 ng/mL    Comment: 0.00-0.03 0.03 ng/mL or less: NEGATIVE  Repeat testing in 3-6 hrs  if clinically indicated. >0.05 ng/mL: POTENTIAL  MYOCARDIAL INJURY. Repeat  testing in 3-6 hrs if  clinically indicated. NOTE: An increase or decrease  of 30% or more on serial  testing suggests a  clinically important change NOTE: New Reference Range  09/03/14   CBC     Status: Abnormal   Collection Time: 09/28/14  2:21 AM  Result Value Ref Range   WBC 8.2 3.8-10.6 x10 3/mm 3   RBC 6.02 (H) 4.40-5.90 x10 6/mm 3   HGB 17.6 13.0-18.0 g/dL   HCT 50.6 40.0-52.0 %   MCV 84 80-100 fL   MCH 29.2 26.0-34.0 pg   MCHC 34.8 32.0-36.0 g/dL   RDW 13.4 11.5-14.5 %   Platelet 174 150-440 x10 3/mm 3  Basic metabolic panel     Status: Abnormal   Collection Time: 09/28/14  2:21 AM  Result Value Ref Range   Glucose, CSF DNP mg/dL    Comment: 65-99 NOTE: New Reference Range  09/03/14    BUN 11 mg/dL    Comment: 6-20 NOTE: New Reference Range  09/03/14    Creatinine 0.95 mg/dL    Comment: 0.61-1.24 NOTE: New Reference Range  09/03/14    Sodium, Urine Random DNP mmol/L    Comment: 135-145 NOTE: New Reference Range  09/03/14    Potassium, Urine Random DNP mmol/L    Comment: 3.5-5.1 NOTE: New Reference Range  09/03/14    Chloride, Urine Random DNP mmol/L    Comment: 101-111 NOTE: New Reference Range  09/03/14    Co2 26 mmol/L    Comment: 22-32 NOTE: New Reference Range  09/03/14    Calcium, Total 8.7 (L) mg/dL    Comment: 8.9-10.3 NOTE: New Reference Range  09/03/14    Anion Gap 7 7-16   EGFR (African American) >60    EGFR (Non-African Amer.) >60     Comment: eGFR values <66m/min/1.73 m2 may be an indication of chronic kidney disease (CKD). Calculated eGFR is useful in patients with stable renal function. The eGFR calculation will not be reliable in acutely ill patients when serum creatinine is changing rapidly. It is not  useful in patients on dialysis. The eGFR calculation may not be applicable to patients at the low and high extremes of body sizes, pregnant women, and vegetarians.    Glucose 416 (H) mg/dL    Comment: 65-99 NOTE: New Reference Range  09/03/14    Sodium 133 (L) mmol/L    Comment: 135-145 NOTE: New Reference Range  09/03/14    Potassium 3.8 mmol/L    Comment: 3.5-5.1 NOTE: New Reference Range  09/03/14    Chloride 100 (L) mmol/L    Comment: 101-111 NOTE: New Reference Range  09/03/14   Troponin I     Status: None   Collection Time: 09/28/14  6:44  AM  Result Value Ref Range   Troponin-I <0.03 ng/mL    Comment: 0.00-0.03 0.03 ng/mL or less: NEGATIVE  Repeat testing in 3-6 hrs  if clinically indicated. >0.05 ng/mL: POTENTIAL  MYOCARDIAL INJURY. Repeat  testing in 3-6 hrs if  clinically indicated. NOTE: An increase or decrease  of 30% or more on serial  testing suggests a  clinically important change NOTE: New Reference Range  09/03/14   D-Dimer [facility])     Status: None   Collection Time: 09/28/14  6:44 AM  Result Value Ref Range   D-Dimer 129 ng/ml    Comment: INTERPRETATION <> Exclusion of Venous Thromboembolism (VTE) - OUTPATIENT ONLY       (Emergency Department or [place])             0-499 ng/ml (FEU)  : With a low to intermediate pretest                                  probability for VTE this test result                                  excludes the diagnosis of VTE.             > 499 ng/ml (FEU)  : VTE not excluded; additional work up                                  for VTE is required. <> Testing on Inpatients and Evaluation of Disseminated Intravascular        Coagulation (DIC)             Reference Range:  0-499 ng/ml (FEU)      Assessment & Plan 1. Diabetes mellitus type 2, uncontrolled Patient's Hba1c goal is <6.5% for stringent control, <7% is acceptable and <8% is reasonable if elderly and is at risk for hypoglycemic events.   Encouraged  patient to continue efforts on checking blood glucose on a daily basis. Fasting blood glucose control goal is 80-128m/dL and post prandial blood glucose control is <1839mdL.  Reviewed diet, exercise, lifestyle changes and current medication regimen pertaining to diabetes with the patient.     - Insulin Pen Needle (NOVOFINE PLUS) 32G X 4 MM MISC; 1 Device by Does not apply route daily.  Dispense: 100 each; Refill: 1 - glipiZIDE (GLUCOTROL) 10 MG tablet; Take 1 tablet (10 mg total) by mouth daily.  Dispense: 90 tablet; Refill: 1

## 2014-12-05 NOTE — Addendum Note (Signed)
Addended by: Rickey Barbara A on: 12/05/2014 03:52 PM   Modules accepted: Orders

## 2014-12-05 NOTE — Patient Instructions (Signed)

## 2014-12-18 ENCOUNTER — Telehealth: Payer: Self-pay

## 2014-12-18 ENCOUNTER — Ambulatory Visit: Payer: BLUE CROSS/BLUE SHIELD | Admitting: Family Medicine

## 2014-12-18 NOTE — Telephone Encounter (Signed)
I tried to contact this patient to see if he wanted to proceed with the diabetic eye referral but there was no answer. A message was left for him stating he did not have to come in for an office visit, but he does have to let us know that he still wants to proceed with the referral. Our number was left so he could call us at his convenience.

## 2015-01-27 ENCOUNTER — Encounter: Payer: Self-pay | Admitting: Family Medicine

## 2015-01-27 ENCOUNTER — Ambulatory Visit (INDEPENDENT_AMBULATORY_CARE_PROVIDER_SITE_OTHER): Payer: BLUE CROSS/BLUE SHIELD | Admitting: Family Medicine

## 2015-01-27 ENCOUNTER — Other Ambulatory Visit: Payer: Self-pay | Admitting: Family Medicine

## 2015-01-27 VITALS — BP 148/90 | HR 86 | Temp 99.1°F | Resp 18 | Wt 229.9 lb

## 2015-01-27 DIAGNOSIS — E1165 Type 2 diabetes mellitus with hyperglycemia: Principal | ICD-10-CM

## 2015-01-27 DIAGNOSIS — G909 Disorder of the autonomic nervous system, unspecified: Secondary | ICD-10-CM | POA: Diagnosis not present

## 2015-01-27 DIAGNOSIS — IMO0002 Reserved for concepts with insufficient information to code with codable children: Secondary | ICD-10-CM

## 2015-01-27 DIAGNOSIS — E1151 Type 2 diabetes mellitus with diabetic peripheral angiopathy without gangrene: Secondary | ICD-10-CM

## 2015-01-27 DIAGNOSIS — E1159 Type 2 diabetes mellitus with other circulatory complications: Secondary | ICD-10-CM

## 2015-01-27 MED ORDER — GABAPENTIN 300 MG PO CAPS: 300.0000 mg | ORAL_CAPSULE | Freq: Three times a day (TID) | ORAL | Status: DC

## 2015-01-27 NOTE — Patient Instructions (Signed)

## 2015-01-27 NOTE — Progress Notes (Signed)
Name: Scott [name]   MRN: [id number]    DOB: [birth year]   Date:01/27/2015       Progress Note  Subjective  Chief Complaint  Chief Complaint  Patient presents with  . Peripheral Neuropathy    patient states he has been having bilateral leg and foot pain especially when they are elevated. Patient stated that they have a feeling of pressure.     HPI  Scott [name] is a 44 year old male with Type 2 Diabetes Mellitus who's current diabetes medication regimen includes Tresiba 12 units a day and Glipizide 10mg  a day. Patient is not taking medications as instructed and is inconsistent with medication use. Could not tolerate GI side effects from Metformin so it was discontinued. Not really checking his blood glucose regularly. Lowest glucose result 190s and usual glucose 200-300 per patient. Today he presents with complaints of lower extremity pain and numbness with a heaviness sensation. Has had infections in his big toes before which took almost 3 months to clear up.   Patient Active Problem List   Diagnosis Date Noted  . CAD in native artery 12/05/2014  . Gastro-esophageal reflux disease without esophagitis 12/05/2014  . Adiposity 12/05/2014  . Disorder of peripheral nervous system 12/05/2014  . Diabetes 12/05/2014  . Abnormal liver enzymes 07/31/2007  . HLD (hyperlipidemia) 07/31/2007  . BP (high blood pressure) 07/31/2007  . Diabetes mellitus type 2, uncontrolled 07/31/2007    History  Substance Use Topics  . Smoking status: Current Every Day Smoker    Types: Cigarettes, E-cigarettes  . Smokeless tobacco: Not on file  . Alcohol Use: No     Current outpatient prescriptions:  .  cyclobenzaprine (FLEXERIL) 5 MG tablet, , Disp: , Rfl:  .  glipiZIDE (GLUCOTROL) 10 MG tablet, Take 1 tablet (10 mg total) by mouth daily., Disp: 90 tablet, Rfl: 1 .  Insulin Degludec (TRESIBA FLEXTOUCH) 200 UNIT/ML SOPN, Inject into the skin., Disp: , Rfl:  .  Insulin Pen Needle (NOVOFINE PLUS)  32G X 4 MM MISC, 1 Device by Does not apply route daily., Disp: 100 each, Rfl: 1 .  omeprazole (PRILOSEC) 40 MG capsule, Take by mouth., Disp: , Rfl:  .  pravastatin (PRAVACHOL) 10 MG tablet, Take by mouth., Disp: , Rfl:   Past Surgical History  Procedure Laterality Date  . Appendectomy    . Cardiac catheterization  07/31/2007    Family History  Problem Relation Age of Onset  . Cancer Mother     brain cancer  . Coronary artery disease Father   . Heart attack Father     Allergies  Allergen Reactions  . Biaxin  [Clarithromycin]     CAUSE RASH  . Penicillins     CAUSE RASH  . Prednisone     FEEL DIZZY     Review of Systems  CONSTITUTIONAL: No significant weight changes, fever, chills, weakness or fatigue.  HEENT:  - Eyes: No visual changes.  - Ears: No auditory changes. No pain.  - Nose: No sneezing, congestion, runny nose. - Throat: No sore throat. No changes in swallowing. SKIN: No rash or itching.  CARDIOVASCULAR: No chest pain, chest pressure or chest discomfort. No palpitations or edema.  RESPIRATORY: No shortness of breath, cough or sputum.  GASTROINTESTINAL: No anorexia, nausea, vomiting. No changes in bowel habits. No abdominal pain or blood.  GENITOURINARY: No dysuria. No frequency. No discharge. NEUROLOGICAL: No headache, dizziness, syncope, ataxia. Yes numbness or tingling in the extremities. No memory changes. No change  in bowel or bladder control.  MUSCULOSKELETAL: Yes foot pain. No muscle pain. HEMATOLOGIC: No anemia, bleeding or bruising.  LYMPHATICS: No enlarged lymph nodes.  PSYCHIATRIC: No change in mood. No change in sleep pattern.  ENDOCRINOLOGIC: No reports of sweating, cold or heat intolerance. No polyuria or polydipsia.     Objective  BP 148/90 mmHg  Pulse 86  Temp(Src) 99.1 F (37.3 C) (Oral)  Resp 18  Wt 229 lb 14.4 oz (104.282 kg)  SpO2 96% Body mass index is 32.99 kg/(m^2).  Physical Exam  Constitutional: Patient appears  well-developed and well-nourished. In no distress.  Neck: Normal range of motion. Neck supple. No JVD present. No thyromegaly present.  Cardiovascular: Normal rate, regular rhythm and normal heart sounds.  No murmur heard.  Pulmonary/Chest: Effort normal and breath sounds normal. No respiratory distress. Musculoskeletal: Normal range of motion bilateral UE and LE, no joint effusions. Peripheral vascular: Bilateral LE no edema. Neurological: CN II-XII grossly intact with no focal deficits. Alert and oriented to person, place, and time. Coordination, balance, strength, speech and gait are normal.  Skin: Skin is warm and dry. No rash noted. No erythema.  Psychiatric: Patient has a normal mood and affect. Behavior is normal in office today. Judgment and thought content normal in office today.  Diabetic Foot Exam - Simple   Simple Foot Form  Diabetic Foot exam was performed with the following findings:  Yes 01/27/2015  3:48 PM  Visual Inspection  No deformities, no ulcerations, no other skin breakdown bilaterally:  Yes  Sensation Testing  See comments:  Yes  Pulse Check  See comments:  Yes  Comments  Decreased sensation to monofilament testing bilaterally. Diminished posterior tibialis and dorsalis pedis pulses 1+/2. No open wounds or ulcers. Some early signs of venous stasis dermatitis at ankles. No swelling.      Recent Results (from the past 2160 hour(s))  POCT glycosylated hemoglobin (Hb A1C)     Status: Abnormal   Collection Time: 12/05/14  3:51 PM  Result Value Ref Range   Hemoglobin A1C 12.2   POCT UA - Microalbumin     Status: Abnormal   Collection Time: 12/05/14  3:51 PM  Result Value Ref Range   Microalbumin Ur, POC 100 mg/L   Creatinine, POC  mg/dL   Albumin/Creatinine Ratio, Urine, POC       Assessment & Plan  1. Type 2 diabetes, uncontrolled, with peripheral circulatory disorder Discussed consequences of uncontrolled DMII including PAD risks and peripheral neuropathy. I  suspect he has a degree of PAD but he declined ABI testing today. His compliance is improving and his glycemic range is improving. Increase Tresiba from 12units to 14units.   - gabapentin (NEURONTIN) 300 MG capsule; Take 1 capsule (300 mg total) by mouth 3 (three) times daily.  Dispense: 90 capsule; Refill: 3  2. Peripheral autonomic neuropathy Continue working on sugar control and re-start gabapentin. The patient has been counseled on the proper use, side effects and potential interactions of the new medication. Patient encouraged to review the side effects and safety profile pamphlet provided with the prescription from the pharmacy as well as request counseling from the pharmacy team as needed.   - gabapentin (NEURONTIN) 300 MG capsule; Take 1 capsule (300 mg total) by mouth 3 (three) times daily.  Dispense: 90 capsule; Refill: 3

## 2015-01-30 ENCOUNTER — Encounter: Payer: Self-pay | Admitting: Family Medicine

## 2015-01-30 ENCOUNTER — Ambulatory Visit (INDEPENDENT_AMBULATORY_CARE_PROVIDER_SITE_OTHER): Payer: BLUE CROSS/BLUE SHIELD | Admitting: Family Medicine

## 2015-01-30 VITALS — BP 136/88 | HR 90 | Temp 98.5°F | Resp 16 | Ht 70.0 in | Wt 229.8 lb

## 2015-01-30 DIAGNOSIS — R109 Unspecified abdominal pain: Secondary | ICD-10-CM | POA: Diagnosis not present

## 2015-01-30 DIAGNOSIS — R319 Hematuria, unspecified: Secondary | ICD-10-CM | POA: Diagnosis not present

## 2015-01-30 DIAGNOSIS — S39012A Strain of muscle, fascia and tendon of lower back, initial encounter: Secondary | ICD-10-CM | POA: Diagnosis not present

## 2015-01-30 LAB — POCT URINALYSIS DIPSTICK
BILIRUBIN UA: NEGATIVE
Glucose, UA: 1000
KETONES UA: NEGATIVE
LEUKOCYTES UA: NEGATIVE
Nitrite, UA: NEGATIVE
PH UA: 6
SPEC GRAV UA: 1.01
UROBILINOGEN UA: 0.2

## 2015-01-30 MED ORDER — CYCLOBENZAPRINE HCL 5 MG PO TABS: 5.0000 mg | ORAL_TABLET | Freq: Three times a day (TID) | ORAL | Status: DC | PRN

## 2015-01-30 MED ORDER — TRAMADOL HCL 50 MG PO TABS: 50.0000 mg | ORAL_TABLET | Freq: Four times a day (QID) | ORAL | Status: DC | PRN

## 2015-01-30 MED ORDER — OXYCODONE-ACETAMINOPHEN 5-325 MG PO TABS: 1.0000 | ORAL_TABLET | Freq: Every evening | ORAL | Status: DC | PRN

## 2015-01-30 NOTE — Patient Instructions (Signed)

## 2015-01-30 NOTE — Progress Notes (Signed)
Name: Scott [name]   MRN: [id number]    DOB: July 15, 1970   Date:01/30/2015       Progress Note  Subjective  Chief Complaint  Chief Complaint  Patient presents with  . Back Pain    Onset-4 days ago and unchanged, in left lower back and radiating to spine area. Worst with any movement and movement with left leg. OTC Ibuprofen, Aleve and Heating pad with no relief. Denies any trauma to area.    HPI  Patient is here today with concerns regarding the following symptoms flank pain left side that started days ago.  Associated with any twisting movement. Initially he figured he had strained his back during sleep so tried Ibuprofen, Aleve and heating pad but symptoms have just progressed. Not associated with fevers, chills, myalgias, urinary color changes, nausea, vomiting, abdominal pain. Works outside a lot and drinks sodas.    Past Medical History  Diagnosis Date  . Diabetes mellitus without complication   . Hyperlipidemia   . Hypertension   . GERD (gastroesophageal reflux disease)   . Elevated liver enzymes   . Coronary artery disease involving coronary bypass graft of native heart without angina pectoris     History  Substance Use Topics  . Smoking status: Current Every Day Smoker    Types: Cigarettes, E-cigarettes  . Smokeless tobacco: Not on file  . Alcohol Use: No     Current outpatient prescriptions:  .  gabapentin (NEURONTIN) 300 MG capsule, Take 1 capsule (300 mg total) by mouth 3 (three) times daily., Disp: 90 capsule, Rfl: 3 .  glipiZIDE (GLUCOTROL) 10 MG tablet, Take 1 tablet (10 mg total) by mouth daily., Disp: 90 tablet, Rfl: 1 .  Insulin Degludec (TRESIBA FLEXTOUCH) 200 UNIT/ML SOPN, Inject 14 Units into the skin daily., Disp: , Rfl:  .  Insulin Pen Needle (NOVOFINE PLUS) 32G X 4 MM MISC, 1 Device by Does not apply route daily., Disp: 100 each, Rfl: 1 .  omeprazole (PRILOSEC) 40 MG capsule, Take by mouth., Disp: , Rfl:  .  pravastatin (PRAVACHOL) 10 MG tablet, Take  by mouth., Disp: , Rfl:  .  cyclobenzaprine (FLEXERIL) 5 MG tablet, , Disp: , Rfl:   Allergies  Allergen Reactions  . Biaxin  [Clarithromycin]     CAUSE RASH  . Penicillins     CAUSE RASH  . Prednisone     FEEL DIZZY    ROS  10 Systems reviewed and is negative except as mentioned in HPI.   Objective  Filed Vitals:   01/30/15 1324  BP: 136/88  Pulse: 90  Temp: 98.5 F (36.9 C)  TempSrc: Oral  Resp: 16  Height: 5\' 10"  (1.778 m)  Weight: 229 lb 12.8 oz (104.237 kg)  SpO2: 96%   Body mass index is 32.97 kg/(m^2).  Recent Results (from the past 2160 hour(s))  POCT glycosylated hemoglobin (Hb A1C)     Status: Abnormal   Collection Time: 12/05/14  3:51 PM  Result Value Ref Range   Hemoglobin A1C 12.2   POCT UA - Microalbumin     Status: Abnormal   Collection Time: 12/05/14  3:51 PM  Result Value Ref Range   Microalbumin Ur, POC 100 mg/L   Creatinine, POC  mg/dL   Albumin/Creatinine Ratio, Urine, POC    POCT Urinalysis Dipstick     Status: None   Collection Time: 01/30/15  1:30 PM  Result Value Ref Range   Color, UA yellow    Clarity, UA cloudy  Glucose, UA 1000    Bilirubin, UA negative    Ketones, UA negative    Spec Grav, UA 1.010    Blood, UA small    pH, UA 6.0    Protein, UA trace    Urobilinogen, UA 0.2    Nitrite, UA negative    Leukocytes, UA Negative Negative    Physical Exam  Constitutional: Patient appears well-developed and well-nourished. In no acute distress but does appear to be uncomfortable from acute illness. Cardiovascular: Normal rate, regular rhythm and normal heart sounds.  No murmur heard.  Pulmonary/Chest: Effort normal and breath sounds normal. No respiratory distress. Abdomen: Soft with normal bowel sounds, no tenderness,, no reproducible flank tenderness bilaterally.  Genitourinary: Exam deferred. Musculoskeletal: Lumbar back normal curvature of spine with no tenderness directly over spinal column no spinal step off. Left sided  lumbosacral paraspinal muscle tension and reproducible tenderness over muscle group. Skin: Skin is warm and dry. No rash noted. No erythema.  Psychiatric: Patient has a normal mood and affect. Behavior is normal in office today. Judgment and thought content normal in office today.   Assessment & Plan 1. Left flank pain Udip shows some hematuria will culture and get abdominal X-ray to rule out stone although clinically suggests lumbar back sprain.  - POCT Urinalysis Dipstick - Urine Culture - DG Abd 2 Views; Future  2. Hematuria  - Urine Culture  3. Strain of lumbar paraspinal muscle, initial encounter We discussed potential pathology and long term risk of reoccurrence. Maintaining an ideal body habitus, regular exercise, proper lifting techniques and mindfulness of exacerbating factors will be useful in long term management.  Instructed on use of heating pad with exercises. Consider concomitant therapy with PT, massage therapist or chiropractor. May use anti-inflammatory medication and muscle relaxer as needed.  - traMADol (ULTRAM) 50 MG tablet; Take 1 tablet (50 mg total) by mouth every 6 (six) hours as needed for moderate pain or severe pain.  Dispense: 30 tablet; Refill: 0 - oxyCODONE-acetaminophen (ROXICET) 5-325 MG per tablet; Take 1 tablet by mouth at bedtime as needed for severe pain.  Dispense: 10 tablet; Refill: 0 - cyclobenzaprine (FLEXERIL) 5 MG tablet; Take 1-2 tablets (5-10 mg total) by mouth 3 (three) times daily as needed for muscle spasms.  Dispense: 30 tablet; Refill: 1

## 2015-01-31 LAB — URINE CULTURE: Organism ID, Bacteria: NO GROWTH

## 2015-02-08 ENCOUNTER — Encounter: Payer: Self-pay | Admitting: Family Medicine

## 2015-02-24 ENCOUNTER — Encounter: Payer: Self-pay | Admitting: Emergency Medicine

## 2015-02-24 ENCOUNTER — Emergency Department
Admission: EM | Admit: 2015-02-24 | Discharge: 2015-02-24 | Disposition: A | Discharge status: Home / Self Care | Payer: Self-pay | Attending: Emergency Medicine | Admitting: Emergency Medicine

## 2015-02-24 DIAGNOSIS — Z79899 Other long term (current) drug therapy: Secondary | ICD-10-CM | POA: Insufficient documentation

## 2015-02-24 DIAGNOSIS — E119 Type 2 diabetes mellitus without complications: Secondary | ICD-10-CM | POA: Insufficient documentation

## 2015-02-24 DIAGNOSIS — Z87891 Personal history of nicotine dependence: Secondary | ICD-10-CM | POA: Insufficient documentation

## 2015-02-24 DIAGNOSIS — M7521 Bicipital tendinitis, right shoulder: Secondary | ICD-10-CM | POA: Insufficient documentation

## 2015-02-24 DIAGNOSIS — Z880 Allergy status to penicillin: Secondary | ICD-10-CM | POA: Insufficient documentation

## 2015-02-24 DIAGNOSIS — Z794 Long term (current) use of insulin: Secondary | ICD-10-CM | POA: Insufficient documentation

## 2015-02-24 DIAGNOSIS — I10 Essential (primary) hypertension: Secondary | ICD-10-CM | POA: Insufficient documentation

## 2015-02-24 MED ORDER — NAPROXEN 500 MG PO TBEC: 500.0000 mg | DELAYED_RELEASE_TABLET | Freq: Two times a day (BID) | ORAL | Status: DC

## 2015-02-24 NOTE — Discharge Instructions (Signed)
Bicipital Tendonitis Bicipital tendonitis refers to redness, soreness, and swelling (inflammation) or irritation of the bicep tendon. The biceps muscle is located between the elbow and shoulder of the inner arm. The tendon heads, similar to pieces of rope, connect the bicep muscle to the shoulder socket. They are called short head and long head tendons. When tendonitis occurs, the long head tendon is inflamed and swollen, and may be thickened or partially torn.  Bicipital tendonitis can occur with other problems as well, such as arthritis in the shoulder or acromioclavicular joints, tears in the tendons, or other rotator cuff problems.  CAUSES  Overuse of of the arms for overhead activities is the major cause of tendonitis. Many athletes, such as swimmers, baseball players, and tennis players are prone to bicipital tendonitis. Jobs that require manual labor or routine chores, especially chores involving overhead activities can result in overuse and tendonitis. SYMPTOMS Symptoms may include:  Pain in and around the front of the shoulder. Pain may be worse with overhead motion.  Pain or aching that radiates down the arm.  Clicking or shifting sensations in the shoulder. DIAGNOSIS Your caregiver may perform the following:  Physical exam and tests of the biceps and shoulder to observe range of motion, strength, and stability.  X-rays or magnetic resonance imaging (MRI) to confirm the diagnosis. In most common cases, these tests are not necessary. Since other problems may exist in the shoulder or rotator cuff, additional tests may be recommended. TREATMENT Treatment may include the following:  Medications  Your caregiver may prescribe over-the-counter pain relievers.  Steroid injections, such as cortisone, may be recommended. These may help to reduce inflammation and pain.  Physical Therapy - Your caregiver may recommend gentle exercises with the arm. These can help restore strength and range  of motion. They may be done at home or with a physical therapist's supervision and input.  Surgery - Arthroscopic or open surgery sometimes is necessary. Surgery may include:  Reattachment or repair of the tendon at the shoulder socket.  Removal of the damaged section of the tendon.  Anchoring the tendon to a different area of the shoulder (tenodesis). HOME CARE INSTRUCTIONS   Avoid overhead motion of the affected arm or any other motion that causes pain.  Take medication for pain as directed. Do not take these for more than 3 weeks, unless directed to do so by your caregiver.  Ice the affected area for 20 minutes at a time, 3-4 times per day. Place a towel on the skin over the painful area and the ice or cold pack over the towel. Do not place ice directly on the skin.  Perform gentle exercises at home as directed. These will increase strength and flexibility. PREVENTION  Modify your activities as much as possible to protect your arm. A physical therapist or sports medicine physician can help you understand options for safe motion.  Avoid repetitive overhead pulling, lifting, reaching, and throwing until your caregiver tells you it is ok to resume these activities. SEEK MEDICAL CARE IF:  Your pain worsens.  You have difficulty moving the affected arm.  You have trouble performing any of the self-care instructions. MAKE SURE YOU:   Understand these instructions.  Will watch your condition.  Will get help right away if you are not doing well or get worse. Document Released: 07/17/2010 Document Revised: 09/06/2011 Document Reviewed: 07/17/2010 [facility] Patient Information 2015 [place], [place]. This information is not intended to replace advice given to you by your health care provider.  Make sure you discuss any questions you have with your health care provider.  Take the prescription anti-inflammatory as directed for shoulder pain. Apply ice to reduce symptoms.  Follow-up with  Dr. Joice Lofts if symptoms persist.

## 2015-02-24 NOTE — ED Provider Notes (Signed)
[facility] Emergency Department Provider Note ____________________________________________  Time seen: 0758  I have reviewed the triage vital signs and the nursing notes.  HISTORY  Chief Complaint  Shoulder Pain  HPI Scott [name] is a 44 y.o. male reports to the ED for evaluation of right shoulder pain for the last 2 weeks.He describes the initial onset occurred while he was trying to upright a large tree stump in the yard. He noticed some immediate pain to the anterior right shoulder and some fleeting tingling down the arm into the hands. He has been limiting the use of his right arm since that time. Last night he was throwing a Frisbee with his dog, and again felt some fleeting tingling to the hands and some pain to the anterior shoulder he stated today for treatment of his symptoms. He denies any grip changes,  referred pain, past problems, and denies any chest pain.  Past Medical History  Diagnosis Date  . Diabetes mellitus without complication   . Hyperlipidemia   . Hypertension   . GERD (gastroesophageal reflux disease)   . Elevated liver enzymes   . Coronary artery disease involving coronary bypass graft of native heart without angina pectoris     Patient Active Problem List   Diagnosis Date Noted  . Left flank pain 01/30/2015  . Hematuria 01/30/2015  . Strain of lumbar paraspinal muscle 01/30/2015  . CAD in native artery 12/05/2014  . Gastro-esophageal reflux disease without esophagitis 12/05/2014  . Adiposity 12/05/2014  . Peripheral autonomic neuropathy 12/05/2014  . Diabetes 12/05/2014  . Abnormal liver enzymes 07/31/2007  . HLD (hyperlipidemia) 07/31/2007  . BP (high blood pressure) 07/31/2007  . Diabetes mellitus type 2, uncontrolled 07/31/2007    Past Surgical History  Procedure Laterality Date  . Appendectomy    . Cardiac catheterization  07/31/2007    Current Outpatient Rx  Name  Route  Sig  Dispense  Refill  . cyclobenzaprine  (FLEXERIL) 5 MG tablet   Oral   Take 1-2 tablets (5-10 mg total) by mouth 3 (three) times daily as needed for muscle spasms.   30 tablet   1   . gabapentin (NEURONTIN) 300 MG capsule   Oral   Take 1 capsule (300 mg total) by mouth 3 (three) times daily.   90 capsule   3   . glipiZIDE (GLUCOTROL) 10 MG tablet   Oral   Take 1 tablet (10 mg total) by mouth daily.   90 tablet   1   . Insulin Pen Needle (NOVOFINE PLUS) 32G X 4 MM MISC   Does not apply   1 Device by Does not apply route daily.   100 each   1   . omeprazole (PRILOSEC) 40 MG capsule   Oral   Take by mouth.         . pravastatin (PRAVACHOL) 10 MG tablet   Oral   Take by mouth.         . cyclobenzaprine (FLEXERIL) 5 MG tablet               . Insulin Degludec (TRESIBA FLEXTOUCH) 200 UNIT/ML SOPN   Subcutaneous   Inject 14 Units into the skin daily.         . naproxen (EC NAPROSYN) 500 MG EC tablet   Oral   Take 1 tablet (500 mg total) by mouth 2 (two) times daily with a meal.   30 tablet   0   . oxyCODONE-acetaminophen (ROXICET) 5-325 MG per  tablet   Oral   Take 1 tablet by mouth at bedtime as needed for severe pain.   10 tablet   0   . traMADol (ULTRAM) 50 MG tablet   Oral   Take 1 tablet (50 mg total) by mouth every 6 (six) hours as needed for moderate pain or severe pain.   30 tablet   0    Allergies Biaxin ; Penicillins; and Prednisone  Family History  Problem Relation Age of Onset  . Cancer Mother     brain cancer  . Coronary artery disease Father   . Heart attack Father     Social History Social History  Substance Use Topics  . Smoking status: Former Smoker    Types: Cigarettes, E-cigarettes  . Smokeless tobacco: None  . Alcohol Use: No   Review of Systems  Constitutional: Negative for fever. Eyes: Negative for visual changes. ENT: Negative for sore throat. Cardiovascular: Negative for chest pain. Respiratory: Negative for shortness of breath. Gastrointestinal:  Negative for abdominal pain, vomiting and diarrhea. Genitourinary: Negative for dysuria. Musculoskeletal: Negative for back pain. Right shoulder pain Skin: Negative for rash. Neurological: Negative for headaches, focal weakness or numbness. ____________________________________________  PHYSICAL EXAM:  VITAL SIGNS: ED Triage Vitals  Enc Vitals Group     BP 02/24/15 0747 163/93 mmHg     Pulse Rate 02/24/15 0747 88     Resp 02/24/15 0747 20     Temp 02/24/15 0747 97.9 F (36.6 C)     Temp Source 02/24/15 0747 Oral     SpO2 02/24/15 0747 94 %     Weight 02/24/15 0747 225 lb (102.059 kg)     Height 02/24/15 0747  (1.778 m)     Head Cir --      Peak Flow --      Pain Score 02/24/15 0748 7     Pain Loc --      Pain Edu? --      Excl. in GC? --    Constitutional: Alert and oriented. Well appearing and in no distress. Eyes: Conjunctivae are normal. PERRL. Normal extraocular movements. ENT   Head: Normocephalic and atraumatic.   Nose: No congestion/rhinnorhea.   Mouth/Throat: Mucous membranes are moist.   Neck: Supple. No thyromegaly. Hematological/Lymphatic/Immunilogical: No cervical lymphadenopathy. Cardiovascular: Normal rate, regular rhythm.  Respiratory: Normal respiratory effort. No wheezes/rales/rhonchi. Gastrointestinal: Soft and nontender. No distention. Musculoskeletal: Right shoulder held in adduction and flexed at the elbow. No deformity, dislocation, or swelling noted. Normal ROM without catch, click, or lock. Normal rotator cuff testing. Minimally tender to palp over the right biceps tendon. No biceps deformity. Negative Yergason's. Normal grips bilaterally. Nontender with normal range of motion in all extremities.  Neurologic:  CN II-XII grossly intact. Normal gait without ataxia. Normal speech and language. No gross focal neurologic deficits are appreciated. Skin:  Skin is warm, dry and intact. No rash noted. Psychiatric: Mood and affect are normal.  Patient exhibits appropriate insight and judgment. __________  INITIAL IMPRESSION / ASSESSMENT AND PLAN / ED COURSE  Right biceps tendinitis. Suggest treatment with naproxen along with home meds. Follow-up with Dr. Joice Lofts for ongoing symptoms.  ____________________________________________  FINAL CLINICAL IMPRESSION(S) / ED DIAGNOSES  Final diagnoses:  Biceps tendinitis on right     Lissa Hoard, PA-C 02/24/15 1610  Sharman Cheek, MD 02/24/15 [phone]

## 2015-02-24 NOTE — ED Notes (Signed)
States he felt a pop in right shoulder about 1-2 weeks ago .developed pain after throwing a frizbee yesterday . Felt another pop. No deformity noted. Pain increased with movement

## 2015-02-24 NOTE — ED Notes (Signed)
Pt presents with right shoulder pain for one week.

## 2015-03-07 ENCOUNTER — Ambulatory Visit: Payer: BLUE CROSS/BLUE SHIELD | Admitting: Family Medicine

## 2015-08-24 ENCOUNTER — Emergency Department
Admission: EM | Admit: 2015-08-24 | Discharge: 2015-08-24 | Disposition: A | Discharge status: Home / Self Care | Payer: Self-pay | Attending: Emergency Medicine | Admitting: Emergency Medicine

## 2015-08-24 DIAGNOSIS — Z880 Allergy status to penicillin: Secondary | ICD-10-CM | POA: Insufficient documentation

## 2015-08-24 DIAGNOSIS — Z791 Long term (current) use of non-steroidal anti-inflammatories (NSAID): Secondary | ICD-10-CM | POA: Insufficient documentation

## 2015-08-24 DIAGNOSIS — I10 Essential (primary) hypertension: Secondary | ICD-10-CM | POA: Insufficient documentation

## 2015-08-24 DIAGNOSIS — E1140 Type 2 diabetes mellitus with diabetic neuropathy, unspecified: Secondary | ICD-10-CM | POA: Insufficient documentation

## 2015-08-24 DIAGNOSIS — Z20828 Contact with and (suspected) exposure to other viral communicable diseases: Secondary | ICD-10-CM | POA: Insufficient documentation

## 2015-08-24 DIAGNOSIS — Z794 Long term (current) use of insulin: Secondary | ICD-10-CM | POA: Insufficient documentation

## 2015-08-24 DIAGNOSIS — Z79899 Other long term (current) drug therapy: Secondary | ICD-10-CM | POA: Insufficient documentation

## 2015-08-24 DIAGNOSIS — J069 Acute upper respiratory infection, unspecified: Secondary | ICD-10-CM | POA: Insufficient documentation

## 2015-08-24 DIAGNOSIS — Z87891 Personal history of nicotine dependence: Secondary | ICD-10-CM | POA: Insufficient documentation

## 2015-08-24 LAB — BASIC METABOLIC PANEL
Anion gap: 9 (ref 5–15)
BUN: 13 mg/dL (ref 6–20)
CHLORIDE: 103 mmol/L (ref 101–111)
CO2: 25 mmol/L (ref 22–32)
Calcium: 9 mg/dL (ref 8.9–10.3)
Creatinine, Ser: 0.88 mg/dL (ref 0.61–1.24)
GFR calc Af Amer: >60 mL/min (ref >60)
GFR calc non Af Amer: >60 mL/min (ref >60)
GLUCOSE: 164 mg/dL — AB (ref 65–99)
POTASSIUM: 3.8 mmol/L (ref 3.5–5.1)
SODIUM: 137 mmol/L (ref 135–145)

## 2015-08-24 LAB — CBC WITH DIFFERENTIAL/PLATELET
Basophils Absolute: 0 10*3/uL (ref 0–0.1)
Basophils Relative: 0 %
EOS ABS: 0.1 10*3/uL (ref 0–0.7)
EOS PCT: 2 %
HCT: 47.9 % (ref 40.0–52.0)
Hemoglobin: 16.6 g/dL (ref 13.0–18.0)
LYMPHS PCT: 12 %
Lymphs Abs: 0.7 10*3/uL — ABNORMAL LOW (ref 1.0–3.6)
MCH: 28.8 pg (ref 26.0–34.0)
MCHC: 34.7 g/dL (ref 32.0–36.0)
MCV: 83.1 fL (ref 80.0–100.0)
MONOS PCT: 14 %
Monocytes Absolute: 0.9 10*3/uL (ref 0.2–1.0)
Neutro Abs: 4.4 10*3/uL (ref 1.4–6.5)
Neutrophils Relative %: 72 %
PLATELETS: 162 10*3/uL (ref 150–440)
RBC: 5.76 MIL/uL (ref 4.40–5.90)
RDW: 13.6 % (ref 11.5–14.5)
WBC: 6.1 10*3/uL (ref 3.8–10.6)

## 2015-08-24 LAB — GLUCOSE, CAPILLARY: GLUCOSE-CAPILLARY: 233 mg/dL — AB (ref 65–99)

## 2015-08-24 LAB — RAPID INFLUENZA A&B ANTIGENS
Influenza A (ARMC): NEGATIVE
Influenza B (ARMC): NEGATIVE

## 2015-08-24 MED ORDER — AZITHROMYCIN 250 MG PO TABS: ORAL_TABLET | ORAL | Status: DC

## 2015-08-24 MED ORDER — IBUPROFEN 800 MG PO TABS
800.0000 mg | ORAL_TABLET | Freq: Once | ORAL | Status: AC
  Administered 2015-08-24: 800 mg via ORAL
  Filled 2015-08-24: qty 1

## 2015-08-24 MED ORDER — BENZONATATE 100 MG PO CAPS: 100.0000 mg | ORAL_CAPSULE | Freq: Three times a day (TID) | ORAL | Status: DC | PRN

## 2015-08-24 MED ORDER — ACETAMINOPHEN 325 MG PO TABS
650.0000 mg | ORAL_TABLET | Freq: Once | ORAL | Status: AC
  Administered 2015-08-24: 650 mg via ORAL

## 2015-08-24 MED ORDER — GUAIFENESIN-CODEINE 100-10 MG/5ML PO SOLN: 5.0000 mL | Freq: Three times a day (TID) | ORAL | Status: DC | PRN

## 2015-08-24 MED ORDER — ACETAMINOPHEN 325 MG PO TABS
ORAL_TABLET | ORAL | Status: AC
  Administered 2015-08-24: 650 mg via ORAL
  Filled 2015-08-24: qty 2

## 2015-08-24 MED ORDER — SODIUM CHLORIDE 0.9 % IV BOLUS (SEPSIS)
1000.0000 mL | Freq: Once | INTRAVENOUS | Status: AC
  Administered 2015-08-24: 1000 mL via INTRAVENOUS

## 2015-08-24 NOTE — ED Notes (Signed)
AAOx3.  Skin warm and dry.  NAD 

## 2015-08-24 NOTE — ED Notes (Addendum)
Pt states since yesterday cough, fever, body aches sweats and chills. Pt is a diabetic and has not had his insulin August of 2016. Loss of insurance. Blood glucose 238 in triage.

## 2015-08-24 NOTE — ED Notes (Signed)
Patient reports sxs started yesterday.  Denies any vomiting or diarrhea but c/o abdominal cramping.  Pt has been coughing and at times the cough is so strong that it makes him gag.  Cough is non-productive.

## 2015-08-24 NOTE — Discharge Instructions (Signed)
Airborne Precautions Germs that spread through the air are called airborne germs. Airborne precautions are rules that help to keep those germs from spreading from one person to another person. RULES FOR PATIENTS  You will be treated in a private room. Keep the door to this room closed. Check with your nurse before you leave the room.  Wear a mask if you go to another part of the hospital or clinic. Make sure the mask fits tightly.  Cover your mouth with a tissue when you cough.  Cover your nose with a tissue when you sneeze.  Wash your hands often. RULES FOR VISITORS  Check with a nurse before you go into a room that has a sign that says "Airborne Precautions."  If a nurse says that you can go in the room, wash your hands and wear a mask over your nose and mouth. Make sure the mask fits tightly.  Do not take off your mask in the room.  Do not eat or drink in the room.  Do not use or touch any items in the room unless you ask a nurse first.  Right after you leave the room, take off your mask and throw it in the trash. Then wash your hands.   This information is not intended to replace advice given to you by your health care provider. Make sure you discuss any questions you have with your health care provider.   Document Released: 12/01/2007 Document Revised: 10/29/2014 Document Reviewed: 05/22/2014 Elsevier Interactive Patient Education 2016 [employer]  Contact Precautions Some germs can be spread by touching the skin or body fluids of someone else. Contact precautions are rules that help to keep those germs from spreading from one person to another person. RULES FOR PATIENTS  Check with your nurse before you leave the room where you are being treated.  Wear a gown and a bandage (dressing) over the infected area if you need to leave the room where you are being treated.  Wash your hands often. RULES FOR VISITORS  Check with a nurse before you go into a room that has a  sign that says "Contact Precautions."  If a nurse says that you can go in the room, wash your hands and put on a gown and gloves.  Do not take off your gown or gloves in the room until you are leaving.  Do not eat or drink in the room unless you ask a nurse first.  Do not touch anything in the room unless you ask a nurse first.  Right before you leave the room, take off your gown and then your gloves. Leave them in the room as told. Then wash your hands.   This information is not intended to replace advice given to you by your health care provider. Make sure you discuss any questions you have with your health care provider.   Document Released: 07/17/2010 Document Revised: 10/29/2014 Document Reviewed: 05/19/2014 Elsevier Interactive Patient Education 2016 [employer]  Viral Infections A virus is a type of germ. Viruses can cause:  Minor sore throats.  Aches and pains.  Headaches.  Runny nose.  Rashes.  Watery eyes.  Tiredness.  Coughs.  Loss of appetite.  Feeling sick to your stomach (nausea).  Throwing up (vomiting).  Watery poop (diarrhea). HOME CARE   Only take medicines as told by your doctor.  Drink enough water and fluids to keep your pee (urine) clear or pale yellow. Sports drinks are a good choice.  Get plenty  of rest and eat healthy. Soups and broths with crackers or rice are fine. GET HELP RIGHT AWAY IF:   You have a very bad headache.  You have shortness of breath.  You have chest pain or neck pain.  You have an unusual rash.  You cannot stop throwing up.  You have watery poop that does not stop.  You cannot keep fluids down.  You or your child has a temperature by mouth above 102 F (38.9 C), not controlled by medicine.  Your baby is older than 3 months with a rectal temperature of 102 F (38.9 C) or higher.  Your baby is 68 months old or younger with a rectal temperature of 100.4 F (38 C) or higher. MAKE SURE YOU:    Understand these instructions.  Will watch this condition.  Will get help right away if you are not doing well or get worse.   This information is not intended to replace advice given to you by your health care provider. Make sure you discuss any questions you have with your health care provider.   Document Released: 05/27/2008 Document Revised: 09/06/2011 Document Reviewed: 11/20/2014 Elsevier Interactive Patient Education 2016 [employer].   You appear to have the flu, based on your symptoms.  You should continue to monitor and treat fevers with Tylenol and Motrin. Increase fluid intake and dose OTC cough, cold, and flu medicines for symptom relief. Follow-up with one of the community clinics for routine medical management. Return to the ED for acutely worsening symptoms like difficulty breathing or chest pains.

## 2015-08-24 NOTE — ED Provider Notes (Signed)
[facility] Emergency Department Provider Note ____________________________________________  Time seen: 1510  I have reviewed the triage vital signs and the nursing notes.  HISTORY  Chief Complaint  Influenza  HPI Scott [name] is a 45 y.o. male presents to the ED for evaluation of sudden onset of body aches, chills, sweats, and subjective fever last night. The patient reports he was well upon awakening and by the evening he noted some increase chills as objective fevers while at a [employer]. He denies any nausea, vomiting, diarrhea. He does confirm during the interview that his 54 year old son was just diagnosed with influenza today, after he awoke this morning with some abdominal pain and fevers. The patient denies receiving a flu vaccine for the season. He is also confirmed that he is a type II diabetic, on insulin, with diabetic neuropathy, hypertension, cholesterol, none of which are currently being managed medically due to his loss of insurance and medication benefits last fall.He reports his headache pain and congestion at 8/10 in triage.  Past Medical History  Diagnosis Date  . Diabetes mellitus without complication   . Hyperlipidemia   . Hypertension   . GERD (gastroesophageal reflux disease)   . Elevated liver enzymes   . Coronary artery disease involving coronary bypass graft of native heart without angina pectoris     Patient Active Problem List   Diagnosis Date Noted  . Left flank pain 01/30/2015  . Hematuria 01/30/2015  . Strain of lumbar paraspinal muscle 01/30/2015  . CAD in native artery 12/05/2014  . Gastro-esophageal reflux disease without esophagitis 12/05/2014  . Adiposity 12/05/2014  . Peripheral autonomic neuropathy 12/05/2014  . Diabetes (HCC) 12/05/2014  . Abnormal liver enzymes 07/31/2007  . HLD (hyperlipidemia) 07/31/2007  . BP (high blood pressure) 07/31/2007  . Diabetes mellitus type 2, uncontrolled (HCC) 07/31/2007     Past Surgical History  Procedure Laterality Date  . Appendectomy    . Cardiac catheterization  07/31/2007    Current Outpatient Rx  Name  Route  Sig  Dispense  Refill  . azithromycin (ZITHROMAX Z-PAK) 250 MG tablet      Take 2 tablets (500 mg) on  Day 1,  followed by 1 tablet (250 mg) once daily on Days 2 through 5.   6 each   0   . benzonatate (TESSALON PERLES) 100 MG capsule   Oral   Take 1 capsule (100 mg total) by mouth 3 (three) times daily as needed for cough (Take 1-2 per dose).   30 capsule   0   . cyclobenzaprine (FLEXERIL) 5 MG tablet               . cyclobenzaprine (FLEXERIL) 5 MG tablet   Oral   Take 1-2 tablets (5-10 mg total) by mouth 3 (three) times daily as needed for muscle spasms.   30 tablet   1   . gabapentin (NEURONTIN) 300 MG capsule   Oral   Take 1 capsule (300 mg total) by mouth 3 (three) times daily.   90 capsule   3   . glipiZIDE (GLUCOTROL) 10 MG tablet   Oral   Take 1 tablet (10 mg total) by mouth daily.   90 tablet   1   . guaiFENesin-codeine 100-10 MG/5ML syrup   Oral   Take 5 mLs by mouth 3 (three) times daily as needed for cough.   120 mL   0   . Insulin Degludec (TRESIBA FLEXTOUCH) 200 UNIT/ML SOPN   Subcutaneous  Inject 14 Units into the skin daily.         . Insulin Pen Needle (NOVOFINE PLUS) 32G X 4 MM MISC   Does not apply   1 Device by Does not apply route daily.   100 each   1   . naproxen (EC NAPROSYN) 500 MG EC tablet   Oral   Take 1 tablet (500 mg total) by mouth 2 (two) times daily with a meal.   30 tablet   0   . omeprazole (PRILOSEC) 40 MG capsule   Oral   Take by mouth.         . oxyCODONE-acetaminophen (ROXICET) 5-325 MG per tablet   Oral   Take 1 tablet by mouth at bedtime as needed for severe pain.   10 tablet   0   . pravastatin (PRAVACHOL) 10 MG tablet   Oral   Take by mouth.         . traMADol (ULTRAM) 50 MG tablet   Oral   Take 1 tablet (50 mg total) by mouth every 6 (six)  hours as needed for moderate pain or severe pain.   30 tablet   0    Allergies Biaxin ; Penicillins; and Prednisone  Family History  Problem Relation Age of Onset  . Cancer Mother     brain cancer  . Coronary artery disease Father   . Heart attack Father     Social History Social History  Substance Use Topics  . Smoking status: Former Smoker    Types: Cigarettes, E-cigarettes  . Smokeless tobacco: Not on file  . Alcohol Use: No   Review of Systems  Constitutional: Positive for fever. Eyes: Negative for visual changes. ENT: Negative for sore throat. Cardiovascular: Negative for chest pain. Reports cough. Respiratory: Negative for shortness of breath. Gastrointestinal: Negative for abdominal pain, vomiting and diarrhea. Genitourinary: Negative for dysuria. Musculoskeletal: Negative for back pain. Skin: Negative for rash. Neurological: Negative for headaches, focal weakness or numbness. ____________________________________________  PHYSICAL EXAM:  VITAL SIGNS: ED Triage Vitals  Enc Vitals Group     BP 08/24/15 1345 141/94 mmHg     Pulse Rate 08/24/15 1345 102     Resp 08/24/15 1345 20     Temp 08/24/15 1345 100.3 F (37.9 C)     Temp Source 08/24/15 1345 Oral     SpO2 08/24/15 1345 95 %     Weight 08/24/15 1345 230 lb (104.327 kg)     Height 08/24/15 1345  (1.778 m)     Head Cir --      Peak Flow --      Pain Score 08/24/15 1347 8     Pain Loc --      Pain Edu? --      Excl. in GC? --    Constitutional: Alert and oriented. Well appearing and in no distress. Head: Normocephalic and atraumatic.      Eyes: Conjunctivae are normal. PERRL. Normal extraocular movements      Ears: Canals clear. TMs intact bilaterally.   Nose: No congestion/rhinorrhea.   Mouth/Throat: Mucous membranes are moist.   Neck: Supple. No thyromegaly. Hematological/Lymphatic/Immunological: No cervical lymphadenopathy. Cardiovascular: Normal rate, regular rhythm.   Respiratory: Normal respiratory effort. No wheezes/rales/rhonchi. Gastrointestinal: Soft and nontender. No distention. Musculoskeletal: Nontender with normal range of motion in all extremities.  Neurologic:  Normal gait without ataxia. Normal speech and language. No gross focal neurologic deficits are appreciated. Skin:  Skin is warm, dry and intact. No  rash noted. Psychiatric: Mood and affect are normal. Patient exhibits appropriate insight and judgment. ____________________________________________   LABS (pertinent positives/negatives) Labs Reviewed  GLUCOSE, CAPILLARY - Abnormal; Notable for the following:    Glucose-Capillary 233 (*)    All other components within normal limits  CBC WITH DIFFERENTIAL/PLATELET - Abnormal; Notable for the following:    Lymphs Abs 0.7 (*)    All other components within normal limits  BASIC METABOLIC PANEL - Abnormal; Notable for the following:    Glucose, Bld 164 (*)    All other components within normal limits  RAPID INFLUENZA A&B ANTIGENS ([facility] ONLY)  ____________________________________________  PROCEDURES  Tylenol 650 mg PO 1000 ml IV bolus IBU 800 mg PO ____________________________________________  INITIAL IMPRESSION / ASSESSMENT AND PLAN / ED COURSE  Patient with symptoms likely related to his influenza exposure. Despite a negative rapid influenza test here today, the patient is presumed to have an infection with influenza. He is discharged with prescriptions for azithromycin for any potential infectious process in the lungs. He is also provided with a prescription for Tessalon Perles and codeine-guaifenesin cough syrup. He will follow with his primary care provider or one of the local Place for ongoing symptom management. He is also given instruction to apply for medication management programs to restart his maintenance medications. A work note is provided for 3 days for symptom  management. ____________________________________________  FINAL CLINICAL IMPRESSION(S) / ED DIAGNOSES  Final diagnoses:  URI (upper respiratory infection)  Exposure to influenza     Lissa Hoard, PA-C 08/24/15 1813  Sharyn Creamer, MD 08/25/15 0002

## 2015-08-25 ENCOUNTER — Telehealth: Payer: Self-pay | Admitting: Emergency Medicine

## 2015-08-25 NOTE — ED Notes (Signed)
[facility] [address]  Called to clarify rx for azithromycin as patient is allergic to clarithromycin.  His allergy is rash--no diff breathing and same for pcn allergy.  Per dr Cyril Loosen, patient can try the azithromycin, or if he prefers not to he can take doxycycline 100 mg twice daily for 5 days.  Called back to [facility] [address].

## 2015-11-15 ENCOUNTER — Encounter: Payer: Self-pay | Admitting: Emergency Medicine

## 2015-11-15 ENCOUNTER — Emergency Department: Payer: Self-pay

## 2015-11-15 ENCOUNTER — Emergency Department
Admission: EM | Admit: 2015-11-15 | Discharge: 2015-11-15 | Disposition: A | Discharge status: Home / Self Care | Payer: Self-pay | Attending: Emergency Medicine | Admitting: Emergency Medicine

## 2015-11-15 DIAGNOSIS — Z951 Presence of aortocoronary bypass graft: Secondary | ICD-10-CM | POA: Insufficient documentation

## 2015-11-15 DIAGNOSIS — I10 Essential (primary) hypertension: Secondary | ICD-10-CM | POA: Insufficient documentation

## 2015-11-15 DIAGNOSIS — Z7984 Long term (current) use of oral hypoglycemic drugs: Secondary | ICD-10-CM | POA: Insufficient documentation

## 2015-11-15 DIAGNOSIS — E1140 Type 2 diabetes mellitus with diabetic neuropathy, unspecified: Secondary | ICD-10-CM | POA: Insufficient documentation

## 2015-11-15 DIAGNOSIS — E119 Type 2 diabetes mellitus without complications: Secondary | ICD-10-CM | POA: Insufficient documentation

## 2015-11-15 DIAGNOSIS — R079 Chest pain, unspecified: Secondary | ICD-10-CM

## 2015-11-15 DIAGNOSIS — Z79899 Other long term (current) drug therapy: Secondary | ICD-10-CM | POA: Insufficient documentation

## 2015-11-15 DIAGNOSIS — Z794 Long term (current) use of insulin: Secondary | ICD-10-CM | POA: Insufficient documentation

## 2015-11-15 DIAGNOSIS — Z87891 Personal history of nicotine dependence: Secondary | ICD-10-CM | POA: Insufficient documentation

## 2015-11-15 DIAGNOSIS — Z791 Long term (current) use of non-steroidal anti-inflammatories (NSAID): Secondary | ICD-10-CM | POA: Insufficient documentation

## 2015-11-15 DIAGNOSIS — E785 Hyperlipidemia, unspecified: Secondary | ICD-10-CM | POA: Insufficient documentation

## 2015-11-15 DIAGNOSIS — I2510 Atherosclerotic heart disease of native coronary artery without angina pectoris: Secondary | ICD-10-CM | POA: Insufficient documentation

## 2015-11-15 LAB — COMPREHENSIVE METABOLIC PANEL
ALK PHOS: 76 U/L (ref 38–126)
ALT: 20 U/L (ref 17–63)
AST: 23 U/L (ref 15–41)
Albumin: 4.4 g/dL (ref 3.5–5.0)
Anion gap: 8 (ref 5–15)
BILIRUBIN TOTAL: 0.5 mg/dL (ref 0.3–1.2)
BUN: 12 mg/dL (ref 6–20)
CALCIUM: 9.2 mg/dL (ref 8.9–10.3)
CO2: 26 mmol/L (ref 22–32)
CREATININE: 0.95 mg/dL (ref 0.61–1.24)
Chloride: 102 mmol/L (ref 101–111)
Glucose, Bld: 261 mg/dL — ABNORMAL HIGH (ref 65–99)
Potassium: 3.6 mmol/L (ref 3.5–5.1)
Sodium: 136 mmol/L (ref 135–145)
Total Protein: 7.8 g/dL (ref 6.5–8.1)

## 2015-11-15 LAB — CBC
HEMATOCRIT: 48.1 % (ref 40.0–52.0)
Hemoglobin: 16.7 g/dL (ref 13.0–18.0)
MCH: 28.7 pg (ref 26.0–34.0)
MCHC: 34.7 g/dL (ref 32.0–36.0)
MCV: 82.6 fL (ref 80.0–100.0)
Platelets: 177 10*3/uL (ref 150–440)
RBC: 5.82 MIL/uL (ref 4.40–5.90)
RDW: 13.7 % (ref 11.5–14.5)
WBC: 7.3 10*3/uL (ref 3.8–10.6)

## 2015-11-15 LAB — TROPONIN I

## 2015-11-15 NOTE — ED Notes (Signed)
L chest pain x 1 hour. Worse on inspiration. No injury.

## 2015-11-15 NOTE — ED Notes (Signed)
Patient and spouse informed about returning here for any changes in pain. Or to call EMS for any worsening symptoms.

## 2015-11-15 NOTE — Discharge Instructions (Signed)
Please seek medical attention for any high fevers, chest pain, shortness of breath, change in behavior, persistent vomiting, bloody stool or any other new or concerning symptoms. ° ° °Nonspecific Chest Pain °It is often hard to find the cause of chest pain. There is always a chance that your pain could be related to something serious, such as a heart attack or a blood clot in your lungs. Chest pain can also be caused by conditions that are not life-threatening. If you have chest pain, it is very important to follow up with your doctor. ° °HOME CARE °· If you were prescribed an antibiotic medicine, finish it all even if you start to feel better. °· Avoid any activities that cause chest pain. °· Do not use any tobacco products, including cigarettes, chewing tobacco, or electronic cigarettes. If you need help quitting, ask your doctor. °· Do not drink alcohol. °· Take medicines only as told by your doctor. °· Keep all follow-up visits as told by your doctor. This is important. This includes any further testing if your chest pain does not go away. °· Your doctor may tell you to keep your head raised (elevated) while you sleep. °· Make lifestyle changes as told by your doctor. These may include: °¨ Getting regular exercise. Ask your doctor to suggest some activities that are safe for you. °¨ Eating a heart-healthy diet. Your doctor or a diet specialist (dietitian) can help you to learn healthy eating options. °¨ Maintaining a healthy weight. °¨ Managing diabetes, if necessary. °¨ Reducing stress. °GET HELP IF: °· Your chest pain does not go away, even after treatment. °· You have a rash with blisters on your chest. °· You have a fever. °GET HELP RIGHT AWAY IF: °· Your chest pain is worse. °· You have an increasing cough, or you cough up blood. °· You have severe belly (abdominal) pain. °· You feel extremely weak. °· You pass out (faint). °· You have chills. °· You have sudden, unexplained chest discomfort. °· You have  sudden, unexplained discomfort in your arms, back, neck, or jaw. °· You have shortness of breath at any time. °· You suddenly start to sweat, or your skin gets clammy. °· You feel nauseous. °· You vomit. °· You suddenly feel light-headed or dizzy. °· Your heart begins to beat quickly, or it feels like it is skipping beats. °These symptoms may be an emergency. Do not wait to see if the symptoms will go away. Get medical help right away. Call your local emergency services (911 in the [place] Do not drive yourself to the hospital. °  °This information is not intended to replace advice given to you by your health care provider. Make sure you discuss any questions you have with your health care provider. °  °Document Released: 12/01/2007 Document Revised: 07/05/2014 Document Reviewed: 01/18/2014 °Elsevier Interactive Patient Education ©2016 [employer] Inc. ° °

## 2015-11-15 NOTE — ED Provider Notes (Signed)
[facility] Emergency Department Provider Note    ____________________________________________  Time seen: ~1740  I have reviewed the triage vital signs and the nursing notes.   HISTORY  Chief Complaint Chest Pain   History limited by: Not Limited   HPI Scott [name] is a 45 y.o. male who presents to the emergency department today because of concern for chest pain. The patient states that he had an episode of chest pain today. He describes it as being located in the central chest. He did have some radiation up into his neck and arm. Patient states that he now feels better. He states he has been having similar pain once or twice a month for a long time. He does have a history of coronary artery disease however last catheterization was roughly 10 years ago. He states he has not had a stress test or catheterization since then. The patient additionally states he hasn't been on his medication for a number of months secondary to losing his insurance. Patient denies any recent fevers.    Past Medical History  Diagnosis Date  . Diabetes mellitus without complication (HCC)   . Hyperlipidemia   . Hypertension   . GERD (gastroesophageal reflux disease)   . Elevated liver enzymes   . Coronary artery disease involving coronary bypass graft of native heart without angina pectoris     Patient Active Problem List   Diagnosis Date Noted  . Left flank pain 01/30/2015  . Hematuria 01/30/2015  . Strain of lumbar paraspinal muscle 01/30/2015  . CAD in native artery 12/05/2014  . Gastro-esophageal reflux disease without esophagitis 12/05/2014  . Adiposity 12/05/2014  . Peripheral autonomic neuropathy 12/05/2014  . Diabetes (HCC) 12/05/2014  . Abnormal liver enzymes 07/31/2007  . HLD (hyperlipidemia) 07/31/2007  . BP (high blood pressure) 07/31/2007  . Diabetes mellitus type 2, uncontrolled (HCC) 07/31/2007    Past Surgical History  Procedure Laterality Date  .  Appendectomy    . Cardiac catheterization  07/31/2007    Current Outpatient Rx  Name  Route  Sig  Dispense  Refill  . azithromycin (ZITHROMAX Z-PAK) 250 MG tablet      Take 2 tablets (500 mg) on  Day 1,  followed by 1 tablet (250 mg) once daily on Days 2 through 5.   6 each   0   . benzonatate (TESSALON PERLES) 100 MG capsule   Oral   Take 1 capsule (100 mg total) by mouth 3 (three) times daily as needed for cough (Take 1-2 per dose).   30 capsule   0   . cyclobenzaprine (FLEXERIL) 5 MG tablet               . cyclobenzaprine (FLEXERIL) 5 MG tablet   Oral   Take 1-2 tablets (5-10 mg total) by mouth 3 (three) times daily as needed for muscle spasms.   30 tablet   1   . gabapentin (NEURONTIN) 300 MG capsule   Oral   Take 1 capsule (300 mg total) by mouth 3 (three) times daily.   90 capsule   3   . glipiZIDE (GLUCOTROL) 10 MG tablet   Oral   Take 1 tablet (10 mg total) by mouth daily.   90 tablet   1   . guaiFENesin-codeine 100-10 MG/5ML syrup   Oral   Take 5 mLs by mouth 3 (three) times daily as needed for cough.   120 mL   0   . Insulin Degludec (TRESIBA FLEXTOUCH) 200 UNIT/ML  SOPN   Subcutaneous   Inject 14 Units into the skin daily.         . Insulin Pen Needle (NOVOFINE PLUS) 32G X 4 MM MISC   Does not apply   1 Device by Does not apply route daily.   100 each   1   . naproxen (EC NAPROSYN) 500 MG EC tablet   Oral   Take 1 tablet (500 mg total) by mouth 2 (two) times daily with a meal.   30 tablet   0   . omeprazole (PRILOSEC) 40 MG capsule   Oral   Take by mouth.         . oxyCODONE-acetaminophen (ROXICET) 5-325 MG per tablet   Oral   Take 1 tablet by mouth at bedtime as needed for severe pain.   10 tablet   0   . pravastatin (PRAVACHOL) 10 MG tablet   Oral   Take by mouth.         . traMADol (ULTRAM) 50 MG tablet   Oral   Take 1 tablet (50 mg total) by mouth every 6 (six) hours as needed for moderate pain or severe pain.   30  tablet   0     Allergies Biaxin ; Penicillins; and Prednisone  Family History  Problem Relation Age of Onset  . Cancer Mother     brain cancer  . Coronary artery disease Father   . Heart attack Father     Social History Social History  Substance Use Topics  . Smoking status: Former Smoker    Types: Cigarettes, E-cigarettes  . Smokeless tobacco: None  . Alcohol Use: No    Review of Systems  Constitutional: Negative for fever. Cardiovascular: Negative for chest pain. Respiratory: Negative for shortness of breath. Gastrointestinal: Negative for abdominal pain, vomiting and diarrhea. Neurological: Negative for headaches, focal weakness or numbness.  10-point ROS otherwise negative.  ____________________________________________   PHYSICAL EXAM:  VITAL SIGNS: ED Triage Vitals  Enc Vitals Group     BP 11/15/15 1436 144/94 mmHg     Pulse Rate 11/15/15 1436 92     Resp 11/15/15 1436 18     Temp 11/15/15 1436 98.1 F (36.7 C)     Temp Source 11/15/15 1436 Oral     SpO2 11/15/15 1436 97 %     Weight 11/15/15 1436 220 lb (99.791 kg)     Height 11/15/15 1436 5\' 10"  (1.778 m)     Head Cir --      Peak Flow --      Pain Score 11/15/15 1438 8   Constitutional: Alert and oriented. Well appearing and in no distress. Eyes: Conjunctivae are normal. PERRL. Normal extraocular movements. ENT   Head: Normocephalic and atraumatic.   Nose: No congestion/rhinnorhea.   Mouth/Throat: Mucous membranes are moist.   Neck: No stridor. Hematological/Lymphatic/Immunilogical: No cervical lymphadenopathy. Cardiovascular: Normal rate, regular rhythm.  No murmurs, rubs, or gallops. Respiratory: Normal respiratory effort without tachypnea nor retractions. Breath sounds are clear and equal bilaterally. No wheezes/rales/rhonchi. Gastrointestinal: Soft and nontender. No distention.  Genitourinary: Deferred Musculoskeletal: Normal range of motion in all extremities. No joint  effusions.  No lower extremity tenderness nor edema. Neurologic:  Normal speech and language. No gross focal neurologic deficits are appreciated.  Skin:  Skin is warm, dry and intact. No rash noted. Psychiatric: Mood and affect are normal. Speech and behavior are normal. Patient exhibits appropriate insight and judgment.  ____________________________________________    LABS (pertinent positives/negatives)  Labs Reviewed  COMPREHENSIVE METABOLIC PANEL - Abnormal; Notable for the following:    Glucose, Bld 261 (*)    All other components within normal limits  CBC  TROPONIN I  TROPONIN I     ____________________________________________   EKG  I, Phineas Semen, attending physician, personally viewed and interpreted this EKG  EKG Time: 1433 Rate: 88 Rhythm: normal sinus rhythm Axis: normal Intervals: qtc 418 QRS: narrow ST changes: no st elevation Impression: normal ekg   ____________________________________________    RADIOLOGY  CXR  IMPRESSION: Normal chest radiographs.  ____________________________________________   PROCEDURES  Procedure(s) performed: None  Critical Care performed: No  ____________________________________________   INITIAL IMPRESSION / ASSESSMENT AND PLAN / ED COURSE  Pertinent labs & imaging results that were available during my care of the patient were reviewed by me and considered in my medical decision making (see chart for details).  Patient presents to the emergency department today with chest pain. Patient does have a history of coronary artery disease. Patient initial troponin negative. No obvious indicator of acute injury on EKG. I discussed with patient that given his high risk certainly admit for further cardiac workup and evaluation. At this point patient would rather not want admission. He states he would stay for a second troponin.    ----------------------------------------- 8:21 PM on  11/15/2015 -----------------------------------------  Second troponin negative. I had a long discussion with patient and wife about this finding. Additionally I again offered admission for the chest pain. I explained that he is high risk. At this point patient again expressed that he would go home. I did discuss with patient that he could come back at any time. Furthermore again I expressed my desire that the patient follow-up with Dr. Sanda Linger on Monday. ____________________________________________   FINAL CLINICAL IMPRESSION(S) / ED DIAGNOSES  Final diagnoses:  Chest pain, unspecified chest pain type     Note: This dictation was prepared with Dragon dictation. Any transcriptional errors that result from this process are unintentional    Phineas Semen, MD 11/15/15 2022

## 2016-08-24 ENCOUNTER — Emergency Department: Payer: Self-pay

## 2016-08-24 ENCOUNTER — Observation Stay
Admission: EM | Admit: 2016-08-24 | Discharge: 2016-08-26 | Disposition: A | Discharge status: Home / Self Care | Payer: Self-pay | Attending: Specialist | Admitting: Specialist

## 2016-08-24 DIAGNOSIS — Z881 Allergy status to other antibiotic agents status: Secondary | ICD-10-CM | POA: Insufficient documentation

## 2016-08-24 DIAGNOSIS — R079 Chest pain, unspecified: Secondary | ICD-10-CM

## 2016-08-24 DIAGNOSIS — Z79899 Other long term (current) drug therapy: Secondary | ICD-10-CM | POA: Insufficient documentation

## 2016-08-24 DIAGNOSIS — E1165 Type 2 diabetes mellitus with hyperglycemia: Secondary | ICD-10-CM | POA: Insufficient documentation

## 2016-08-24 DIAGNOSIS — Z951 Presence of aortocoronary bypass graft: Secondary | ICD-10-CM | POA: Insufficient documentation

## 2016-08-24 DIAGNOSIS — Z955 Presence of coronary angioplasty implant and graft: Secondary | ICD-10-CM | POA: Insufficient documentation

## 2016-08-24 DIAGNOSIS — Z8249 Family history of ischemic heart disease and other diseases of the circulatory system: Secondary | ICD-10-CM | POA: Insufficient documentation

## 2016-08-24 DIAGNOSIS — I10 Essential (primary) hypertension: Secondary | ICD-10-CM | POA: Diagnosis present

## 2016-08-24 DIAGNOSIS — Z87891 Personal history of nicotine dependence: Secondary | ICD-10-CM | POA: Insufficient documentation

## 2016-08-24 DIAGNOSIS — Z9049 Acquired absence of other specified parts of digestive tract: Secondary | ICD-10-CM | POA: Insufficient documentation

## 2016-08-24 DIAGNOSIS — E669 Obesity, unspecified: Secondary | ICD-10-CM | POA: Insufficient documentation

## 2016-08-24 DIAGNOSIS — Z888 Allergy status to other drugs, medicaments and biological substances status: Secondary | ICD-10-CM | POA: Insufficient documentation

## 2016-08-24 DIAGNOSIS — Z808 Family history of malignant neoplasm of other organs or systems: Secondary | ICD-10-CM | POA: Insufficient documentation

## 2016-08-24 DIAGNOSIS — IMO0001 Reserved for inherently not codable concepts without codable children: Secondary | ICD-10-CM

## 2016-08-24 DIAGNOSIS — E785 Hyperlipidemia, unspecified: Secondary | ICD-10-CM | POA: Diagnosis present

## 2016-08-24 DIAGNOSIS — I252 Old myocardial infarction: Secondary | ICD-10-CM | POA: Insufficient documentation

## 2016-08-24 DIAGNOSIS — Z6830 Body mass index (BMI) 30.0-30.9, adult: Secondary | ICD-10-CM | POA: Insufficient documentation

## 2016-08-24 DIAGNOSIS — K219 Gastro-esophageal reflux disease without esophagitis: Secondary | ICD-10-CM | POA: Diagnosis present

## 2016-08-24 DIAGNOSIS — Z9119 Patient's noncompliance with other medical treatment and regimen: Secondary | ICD-10-CM | POA: Insufficient documentation

## 2016-08-24 DIAGNOSIS — R55 Syncope and collapse: Secondary | ICD-10-CM

## 2016-08-24 DIAGNOSIS — IMO0002 Reserved for concepts with insufficient information to code with codable children: Secondary | ICD-10-CM | POA: Diagnosis present

## 2016-08-24 DIAGNOSIS — I2510 Atherosclerotic heart disease of native coronary artery without angina pectoris: Secondary | ICD-10-CM | POA: Diagnosis present

## 2016-08-24 DIAGNOSIS — E1140 Type 2 diabetes mellitus with diabetic neuropathy, unspecified: Secondary | ICD-10-CM | POA: Insufficient documentation

## 2016-08-24 DIAGNOSIS — Z794 Long term (current) use of insulin: Secondary | ICD-10-CM | POA: Insufficient documentation

## 2016-08-24 DIAGNOSIS — Z880 Allergy status to penicillin: Secondary | ICD-10-CM | POA: Insufficient documentation

## 2016-08-24 DIAGNOSIS — R51 Headache: Secondary | ICD-10-CM

## 2016-08-24 DIAGNOSIS — R519 Headache, unspecified: Secondary | ICD-10-CM

## 2016-08-24 DIAGNOSIS — Z9114 Patient's other noncompliance with medication regimen: Secondary | ICD-10-CM | POA: Insufficient documentation

## 2016-08-24 DIAGNOSIS — I209 Angina pectoris, unspecified: Secondary | ICD-10-CM | POA: Diagnosis present

## 2016-08-24 DIAGNOSIS — I25119 Atherosclerotic heart disease of native coronary artery with unspecified angina pectoris: Principal | ICD-10-CM | POA: Insufficient documentation

## 2016-08-24 DIAGNOSIS — Z791 Long term (current) use of non-steroidal anti-inflammatories (NSAID): Secondary | ICD-10-CM | POA: Insufficient documentation

## 2016-08-24 LAB — CBC
HCT: 49.8 % (ref 40.0–52.0)
Hemoglobin: 17.3 g/dL (ref 13.0–18.0)
MCH: 28.4 pg (ref 26.0–34.0)
MCHC: 34.8 g/dL (ref 32.0–36.0)
MCV: 81.7 fL (ref 80.0–100.0)
PLATELETS: 191 10*3/uL (ref 150–440)
RBC: 6.1 MIL/uL — ABNORMAL HIGH (ref 4.40–5.90)
RDW: 13.2 % (ref 11.5–14.5)
WBC: 8.9 10*3/uL (ref 3.8–10.6)

## 2016-08-24 LAB — URINALYSIS, COMPLETE (UACMP) WITH MICROSCOPIC
Bilirubin Urine: NEGATIVE
HGB URINE DIPSTICK: NEGATIVE
Ketones, ur: NEGATIVE mg/dL
Leukocytes, UA: NEGATIVE
Nitrite: NEGATIVE
PH: 8 (ref 5.0–8.0)
Protein, ur: NEGATIVE mg/dL
SPECIFIC GRAVITY, URINE: 1.001 — AB (ref 1.005–1.030)
SQUAMOUS EPITHELIAL / LPF: NONE SEEN

## 2016-08-24 LAB — BASIC METABOLIC PANEL
Anion gap: 8 (ref 5–15)
BUN: 8 mg/dL (ref 6–20)
CALCIUM: 10 mg/dL (ref 8.9–10.3)
CHLORIDE: 100 mmol/L — AB (ref 101–111)
CO2: 26 mmol/L (ref 22–32)
CREATININE: 0.99 mg/dL (ref 0.61–1.24)
GFR calc non Af Amer: >60 mL/min (ref >60)
Glucose, Bld: 177 mg/dL — ABNORMAL HIGH (ref 65–99)
Potassium: 3.3 mmol/L — ABNORMAL LOW (ref 3.5–5.1)
SODIUM: 134 mmol/L — AB (ref 135–145)

## 2016-08-24 LAB — TROPONIN I: Troponin I: <0.03 ng/mL (ref <0.03)

## 2016-08-24 MED ORDER — PRAVASTATIN SODIUM 10 MG PO TABS
10.0000 mg | ORAL_TABLET | Freq: Every day | ORAL | Status: DC
  Administered 2016-08-25: 10 mg via ORAL
  Filled 2016-08-24: qty 1

## 2016-08-24 MED ORDER — SODIUM CHLORIDE 0.9% FLUSH
3.0000 mL | Freq: Two times a day (BID) | INTRAVENOUS | Status: DC
  Administered 2016-08-25 – 2016-08-26 (×4): 3 mL via INTRAVENOUS

## 2016-08-24 MED ORDER — DIPHENHYDRAMINE HCL 50 MG/ML IJ SOLN
25.0000 mg | Freq: Once | INTRAMUSCULAR | Status: AC
  Administered 2016-08-24: 25 mg via INTRAVENOUS
  Filled 2016-08-24: qty 1

## 2016-08-24 MED ORDER — TRAMADOL HCL 50 MG PO TABS: 50.0000 mg | ORAL_TABLET | Freq: Four times a day (QID) | ORAL | Status: DC | PRN

## 2016-08-24 MED ORDER — SODIUM CHLORIDE 0.9 % IV BOLUS (SEPSIS)
1000.0000 mL | Freq: Once | INTRAVENOUS | Status: AC
  Administered 2016-08-24: 1000 mL via INTRAVENOUS

## 2016-08-24 MED ORDER — ONDANSETRON HCL 4 MG/2ML IJ SOLN: 4.0000 mg | Freq: Four times a day (QID) | INTRAMUSCULAR | Status: DC | PRN

## 2016-08-24 MED ORDER — INSULIN ASPART 100 UNIT/ML ~~LOC~~ SOLN
0.0000 [IU] | Freq: Four times a day (QID) | SUBCUTANEOUS | Status: DC
  Administered 2016-08-25: 2 [IU] via SUBCUTANEOUS
  Administered 2016-08-25: 5 [IU] via SUBCUTANEOUS
  Administered 2016-08-25: 2 [IU] via SUBCUTANEOUS
  Administered 2016-08-25: 1 [IU] via SUBCUTANEOUS
  Administered 2016-08-26: 2 [IU] via SUBCUTANEOUS
  Administered 2016-08-26: 1 [IU] via SUBCUTANEOUS
  Administered 2016-08-26: 2 [IU] via SUBCUTANEOUS
  Filled 2016-08-24 (×3): qty 2
  Filled 2016-08-24: qty 1
  Filled 2016-08-24: qty 5
  Filled 2016-08-24: qty 2
  Filled 2016-08-24: qty 1

## 2016-08-24 MED ORDER — PANTOPRAZOLE SODIUM 40 MG PO TBEC
40.0000 mg | DELAYED_RELEASE_TABLET | Freq: Every day | ORAL | Status: DC
  Administered 2016-08-25 – 2016-08-26 (×2): 40 mg via ORAL
  Filled 2016-08-24 (×2): qty 1

## 2016-08-24 MED ORDER — ONDANSETRON HCL 4 MG PO TABS: 4.0000 mg | ORAL_TABLET | Freq: Four times a day (QID) | ORAL | Status: DC | PRN

## 2016-08-24 MED ORDER — ACETAMINOPHEN 325 MG PO TABS
650.0000 mg | ORAL_TABLET | Freq: Four times a day (QID) | ORAL | Status: DC | PRN
  Administered 2016-08-25: 650 mg via ORAL
  Filled 2016-08-24: qty 2

## 2016-08-24 MED ORDER — PROCHLORPERAZINE EDISYLATE 5 MG/ML IJ SOLN
10.0000 mg | Freq: Once | INTRAMUSCULAR | Status: AC
  Administered 2016-08-24: 10 mg via INTRAVENOUS
  Filled 2016-08-24: qty 2

## 2016-08-24 MED ORDER — ENOXAPARIN SODIUM 40 MG/0.4ML ~~LOC~~ SOLN
40.0000 mg | SUBCUTANEOUS | Status: DC
  Administered 2016-08-25 – 2016-08-26 (×2): 40 mg via SUBCUTANEOUS
  Filled 2016-08-24 (×2): qty 0.4

## 2016-08-24 MED ORDER — ASPIRIN 81 MG PO CHEW
81.0000 mg | CHEWABLE_TABLET | Freq: Every day | ORAL | Status: DC
  Administered 2016-08-25 – 2016-08-26 (×2): 81 mg via ORAL
  Filled 2016-08-24 (×2): qty 1

## 2016-08-24 MED ORDER — GABAPENTIN 300 MG PO CAPS
300.0000 mg | ORAL_CAPSULE | Freq: Three times a day (TID) | ORAL | Status: DC
  Administered 2016-08-25 – 2016-08-26 (×6): 300 mg via ORAL
  Filled 2016-08-24 (×6): qty 1

## 2016-08-24 MED ORDER — OXYCODONE HCL 5 MG PO TABS: 5.0000 mg | ORAL_TABLET | ORAL | Status: DC | PRN

## 2016-08-24 MED ORDER — CLOPIDOGREL BISULFATE 75 MG PO TABS
75.0000 mg | ORAL_TABLET | Freq: Every day | ORAL | Status: DC
  Administered 2016-08-25 – 2016-08-26 (×3): 75 mg via ORAL
  Filled 2016-08-24 (×3): qty 1

## 2016-08-24 MED ORDER — ACETAMINOPHEN 650 MG RE SUPP: 650.0000 mg | Freq: Four times a day (QID) | RECTAL | Status: DC | PRN

## 2016-08-24 MED ORDER — ACETAMINOPHEN 500 MG PO TABS
1000.0000 mg | ORAL_TABLET | Freq: Once | ORAL | Status: AC
  Administered 2016-08-24: 1000 mg via ORAL
  Filled 2016-08-24: qty 2

## 2016-08-24 NOTE — ED Notes (Signed)
Pt breathing back at normal rate. Pt appears much more calm.

## 2016-08-24 NOTE — ED Notes (Signed)
ED Provider at bedside. 

## 2016-08-24 NOTE — ED Triage Notes (Addendum)
Pt arrives to ER via [facility] from home for possible syncope and fall. Pt reports he felt dizzy and remembers falling forward but does remember falling down. Pt found by wife. Pt c/o headache that began earlier today 8/10. Pt noncompliant with medications X 8 months. Pt denies alcohol use. Pt appears very anxious, hyperventilating.   CBG 210 with [facility]. Pt alert and oriented X 4 at this time. No obvious injuries.   Pt is diabetic, taking insulin for his diabetes and quit taking and stopped checked CBG in August. Pt stopped taking his medications in August because "I was sick of feeling like crap."

## 2016-08-24 NOTE — H&P (Signed)
[facility] -  at [facility]   PATIENT NAME: Scott [name]    MR#:  [id number]  DATE OF BIRTH:  1970-09-12  DATE OF ADMISSION:  08/24/2016  PRIMARY CARE PHYSICIAN: [facility]   REQUESTING/REFERRING PHYSICIAN: Roxan Hockey, MD  CHIEF COMPLAINT:   Chief Complaint  Patient presents with  . Near Syncope    HISTORY OF PRESENT ILLNESS:  Scott [name]  is a 46 y.o. male who presents with Near syncopal episode and central chest pain. Patient had drug-eluting stent placed about 9 months ago. Several weeks ago he decided to stop taking his medications, including his Plavix. The past month or so he's been having intermittent chest pain, which has become more frequent and more intense. His chest pain is nonradiating, but it is exertional in nature, and associated with some shortness of breath. Here in the ED his initial workup was largely within normal limits, but given his risk factors hospitalists were called for further evaluation.  PAST MEDICAL HISTORY:   Past Medical History:  Diagnosis Date  . Coronary artery disease involving coronary bypass graft of native heart without angina pectoris   . Diabetes mellitus without complication (HCC)   . Elevated liver enzymes   . GERD (gastroesophageal reflux disease)   . Hyperlipidemia   . Hypertension     PAST SURGICAL HISTORY:   Past Surgical History:  Procedure Laterality Date  . APPENDECTOMY    . CARDIAC CATHETERIZATION  07/31/2007    SOCIAL HISTORY:   Social History  Substance Use Topics  . Smoking status: Former Smoker    Types: Cigarettes, E-cigarettes  . Smokeless tobacco: Not on file  . Alcohol use No    FAMILY HISTORY:   Family History  Problem Relation Age of Onset  . Cancer Mother     brain cancer  . Coronary artery disease Father   . Heart attack Father     DRUG ALLERGIES:   Allergies  Allergen Reactions  . Biaxin  [Clarithromycin]     CAUSE RASH  .  Penicillins     CAUSE RASH  . Prednisone     FEEL DIZZY    MEDICATIONS AT HOME:   Prior to Admission medications   Medication Sig Start Date End Date Taking? Authorizing Provider  azithromycin (ZITHROMAX Z-PAK) 250 MG tablet Take 2 tablets (500 mg) on  Day 1,  followed by 1 tablet (250 mg) once daily on Days 2 through 5. 08/24/15   Jenise V Bacon Menshew, PA-C  benzonatate (TESSALON PERLES) 100 MG capsule Take 1 capsule (100 mg total) by mouth 3 (three) times daily as needed for cough (Take 1-2 per dose). 08/24/15   Jenise V Bacon Menshew, PA-C  cyclobenzaprine (FLEXERIL) 5 MG tablet  10/07/14   Historical Provider, MD  cyclobenzaprine (FLEXERIL) 5 MG tablet Take 1-2 tablets (5-10 mg total) by mouth 3 (three) times daily as needed for muscle spasms. 01/30/15   Edwena Felty, MD  gabapentin (NEURONTIN) 300 MG capsule Take 1 capsule (300 mg total) by mouth 3 (three) times daily. 01/27/15   Edwena Felty, MD  glipiZIDE (GLUCOTROL) 10 MG tablet Take 1 tablet (10 mg total) by mouth daily. 12/05/14   Edwena Felty, MD  guaiFENesin-codeine 100-10 MG/5ML syrup Take 5 mLs by mouth 3 (three) times daily as needed for cough. 08/24/15   Jenise V Bacon Menshew, PA-C  Insulin Degludec (TRESIBA FLEXTOUCH) 200 UNIT/ML SOPN Inject 14 Units into the skin daily. 11/05/14   Historical Provider, MD  Insulin Pen Needle (NOVOFINE PLUS) 32G X 4 MM MISC 1 Device by Does not apply route daily. 12/05/14   Edwena Felty, MD  naproxen (EC NAPROSYN) 500 MG EC tablet Take 1 tablet (500 mg total) by mouth 2 (two) times daily with a meal. 02/24/15   Jenise V Bacon Menshew, PA-C  omeprazole (PRILOSEC) 40 MG capsule Take by mouth. 11/05/14   Historical Provider, MD  oxyCODONE-acetaminophen (ROXICET) 5-325 MG per tablet Take 1 tablet by mouth at bedtime as needed for severe pain. 01/30/15   Edwena Felty, MD  pravastatin (PRAVACHOL) 10 MG tablet Take by mouth. 11/06/14   Historical Provider, MD  traMADol (ULTRAM) 50 MG tablet Take 1  tablet (50 mg total) by mouth every 6 (six) hours as needed for moderate pain or severe pain. 01/30/15   Edwena Felty, MD    REVIEW OF SYSTEMS:  Review of Systems  Constitutional: Negative for chills, fever, malaise/fatigue and weight loss.  HENT: Negative for ear pain, hearing loss and tinnitus.   Eyes: Negative for blurred vision, double vision, pain and redness.  Respiratory: Positive for shortness of breath. Negative for cough and hemoptysis.   Cardiovascular: Positive for chest pain. Negative for palpitations, orthopnea and leg swelling.  Gastrointestinal: Negative for abdominal pain, constipation, diarrhea, nausea and vomiting.  Genitourinary: Negative for dysuria, frequency and hematuria.  Musculoskeletal: Negative for back pain, joint pain and neck pain.  Skin:       No acne, rash, or lesions  Neurological: Negative for dizziness, tremors, focal weakness and weakness.  Endo/Heme/Allergies: Negative for polydipsia. Does not bruise/bleed easily.  Psychiatric/Behavioral: Negative for depression. The patient is not nervous/anxious and does not have insomnia.      VITAL SIGNS:   Vitals:   08/24/16 1930 08/24/16 2000 08/24/16 2030 08/24/16 2100  BP: (!) 148/94 133/82 126/81 131/90  Pulse:  89 93 96  Resp: (!) 25 17 (!) 23 (!) 22  Temp:      TempSrc:      SpO2:  (!) 88% 98% 97%  Weight:      Height:       Wt Readings from Last 3 Encounters:  08/24/16 97.1 kg (214 lb)  11/15/15 99.8 kg (220 lb)  08/24/15 104.3 kg (230 lb)    PHYSICAL EXAMINATION:  Physical Exam  Vitals reviewed. Constitutional: He is oriented to person, place, and time. He appears well-developed and well-nourished. No distress.  HENT:  Head: Normocephalic and atraumatic.  Mouth/Throat: Oropharynx is clear and moist.  Eyes: Conjunctivae and EOM are normal. Pupils are equal, round, and reactive to light. No scleral icterus.  Neck: Normal range of motion. Neck supple. No JVD present. No thyromegaly  present.  Cardiovascular: Normal rate, regular rhythm and intact distal pulses.  Exam reveals no gallop and no friction rub.   No murmur heard. Respiratory: Effort normal and breath sounds normal. No respiratory distress. He has no wheezes. He has no rales.  GI: Soft. Bowel sounds are normal. He exhibits no distension. There is no tenderness.  Musculoskeletal: Normal range of motion. He exhibits no edema.  No arthritis, no gout  Lymphadenopathy:    He has no cervical adenopathy.  Neurological: He is alert and oriented to person, place, and time. No cranial nerve deficit.  No dysarthria, no aphasia  Skin: Skin is warm and dry. No rash noted. No erythema.  Psychiatric: He has a normal mood and affect. His behavior is normal. Judgment and thought content normal.    LABORATORY PANEL:  CBC  Recent Labs Lab 08/24/16 1921  WBC 8.9  HGB 17.3  HCT 49.8  PLT 191   ------------------------------------------------------------------------------------------------------------------  Chemistries   Recent Labs Lab 08/24/16 1921  NA 134*  K 3.3*  CL 100*  CO2 26  GLUCOSE 177*  BUN 8  CREATININE 0.99  CALCIUM 10.0   ------------------------------------------------------------------------------------------------------------------  Cardiac Enzymes  Recent Labs Lab 08/24/16 1921  TROPONINI <0.03   ------------------------------------------------------------------------------------------------------------------  RADIOLOGY:  Dg Chest 2 View  Result Date: 08/24/2016 CLINICAL DATA:  Syncope. EXAM: CHEST  2 VIEW COMPARISON:  11/15/2015 FINDINGS: The cardiomediastinal contours are normal. The lungs are clear. Pulmonary vasculature is normal. No consolidation, pleural effusion, or pneumothorax. No acute osseous abnormalities are seen. IMPRESSION: No acute abnormality. Electronically Signed   By: Rubye OaksMelanie  Ehinger M.D.   On: 08/24/2016 21:16   Ct Head Wo Contrast  Result Date:  08/24/2016 CLINICAL DATA:  Initial encounter for Pt arrives to ER via [facility] from home for possible syncope and fall. Pt reports he felt dizzy and remembers falling forward but does remember falling down. Pt found by wife. Pt c/o headache that began earlier today 8/10. EXAM: CT HEAD WITHOUT CONTRAST TECHNIQUE: Contiguous axial images were obtained from the base of the skull through the vertex without intravenous contrast. COMPARISON:  04/30/2009 FINDINGS: Brain: No mass lesion, hemorrhage, hydrocephalus, acute infarct, intra-axial, or extra-axial fluid collection. Vascular: No hyperdense vessel or unexpected calcification. Skull: No significant soft tissue swelling.  No skull fracture. Sinuses/Orbits: Normal orbits and globes. Incompletely imaged left maxillary sinus mucous retention cyst or polyp. Clear mastoid air cells. Other: None. IMPRESSION: No acute intracranial abnormality. Electronically Signed   By: Jeronimo GreavesKyle  Talbot M.D.   On: 08/24/2016 20:49    EKG:   Orders placed or performed during the hospital encounter of 08/24/16  . ED EKG  . ED EKG  . EKG 12-Lead  . EKG 12-Lead  . EKG 12-Lead  . EKG 12-Lead    IMPRESSION AND PLAN:  Principal Problem:   Angina pectoris (HCC) - trend cardiac enzymes tonight, get an echocardiogram and cardiology consult in the morning. Active Problems:   CAD in native artery - workup as above, restart Plavix and aspirin.   BP (high blood pressure) - continue home meds   Diabetes mellitus type 2, uncontrolled (HCC) - sliding scale insulin with corresponding glucose checks   Gastro-esophageal reflux disease without esophagitis - home dose PPI   HLD (hyperlipidemia) - home dose statin  All the records are reviewed and case discussed with ED provider. Management plans discussed with the patient and/or family.  DVT PROPHYLAXIS: SubQ lovenox  GI PROPHYLAXIS: PPI  ADMISSION STATUS: Observation  CODE STATUS: Full Code Status History    This patient does not have  a recorded code status. Please follow your organizational policy for patients in this situation.      TOTAL TIME TAKING CARE OF THIS PATIENT: 40 minutes.    Leianne Callins FIELDING 08/24/2016, 10:03 PM  [facility]  Office  [phone]  CC: [facility]

## 2016-08-24 NOTE — ED Notes (Signed)
Pt resting in bed with eyes closed, lights dimmed. Wife at bedside.

## 2016-08-24 NOTE — ED Notes (Addendum)
Pt resting in stretcher at this time, appears to be resting. Pt aroused with verbal and physical stimuli.    Pt wife reports that pt was c/o "a little bit" of chest pain earlier in day. Pt did not offer this info to RN.

## 2016-08-24 NOTE — ED Provider Notes (Signed)
[facility] Emergency Department Provider Note    First MD Initiated Contact with Patient 08/24/16 1930     (approximate)  I have reviewed the triage vital signs and the nursing notes.   HISTORY  Chief Complaint Near Syncope    HPI Vittorio Mohs is a 46 y.o. male who presents with multiple complaints including intermittent chest discomfort with shortness of breath as well as lightheadedness headache and syncopal event that occurred today after the patient was working out in his barn. States that he was feeling dizzy, came back to the house. Sat down for some time. Started feeling more lightheaded with some chest discomfort. States he remembers falling forwards but does not remember falling down. Found by his wife on the ground. Complaining of headache as 8 out of 10 in severity. Patient states is also been noncompliant with his medications for the past several months. Has not been on aspirin and Plavix despite having a stent placed within the past year.  From Dr. Jaymes Graff, [facility], note on 11/21/15:  46yo M DM2 (A1c 8.9%), HTN/HLD, GERD, anxiety here with chest pain for ACS rule-out. Remained HD stable with negative troponin and normal EKG. TTE 5/23 negative for ischemic insult with mild LVH, EF 55% and normal RV function. 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 PET myocardial stress test showed small mild apical lateral and mid-anterior segmental ischemia that is completely reversible. LHC was performed 11/20/15 and showed Coronary artery disease including 95% stenosis of a large, bifurcating first diagonal. Successful PCI of superior branch of D1 with placement of a Resolute drug eluting stent.  Of note, he reported noncompliance with his prior home DM and HTN regimen. Here, he was started on ASA 81mg  daily, Plavix 75mg  daily, Metoprolol succinate 25mg  daily, Lisinopril 20mg  qd. It  should be noted that nitroglycerin is not indicated as an anti-anginal for myocardial bridging-related chest pain.      Past Medical History:  Diagnosis Date  . Coronary artery disease involving coronary bypass graft of native heart without angina pectoris   . Diabetes mellitus without complication (HCC)   . Elevated liver enzymes   . GERD (gastroesophageal reflux disease)   . Hyperlipidemia   . Hypertension    Family History  Problem Relation Age of Onset  . Cancer Mother     brain cancer  . Coronary artery disease Father   . Heart attack Father    Past Surgical History:  Procedure Laterality Date  . APPENDECTOMY    . CARDIAC CATHETERIZATION  07/31/2007   Patient Active Problem List   Diagnosis Date Noted  . Angina pectoris (HCC) 08/24/2016  . Left flank pain 01/30/2015  . Hematuria 01/30/2015  . Strain of lumbar paraspinal muscle 01/30/2015  . CAD in native artery 12/05/2014  . Gastro-esophageal reflux disease without esophagitis 12/05/2014  . Adiposity 12/05/2014  . Peripheral autonomic neuropathy 12/05/2014  . Diabetes (HCC) 12/05/2014  . Abnormal liver enzymes 07/31/2007  . HLD (hyperlipidemia) 07/31/2007  . BP (high blood pressure) 07/31/2007  . Diabetes mellitus type 2, uncontrolled (HCC) 07/31/2007      Prior to Admission medications   Medication Sig Start Date End Date Taking? Authorizing Provider  Insulin Pen Needle (NOVOFINE PLUS) 32G X 4 MM MISC 1 Device by Does not apply route daily. 12/05/14   Edwena Felty, MD    Allergies Biaxin  [clarithromycin]; Penicillins; and Prednisone    Social  History Social History  Substance Use Topics  . Smoking status: Former Smoker    Types: Cigarettes, E-cigarettes  . Smokeless tobacco: Not on file  . Alcohol use No    Review of Systems Patient denies headaches, rhinorrhea, blurry vision, numbness, shortness of breath, chest pain, edema, cough, abdominal pain, nausea, vomiting, diarrhea, dysuria, fevers,  rashes or hallucinations unless otherwise stated above in HPI. ____________________________________________   PHYSICAL EXAM:  VITAL SIGNS: Vitals:   08/24/16 2330 08/25/16 0001  BP: 107/81 109/66  Pulse: 72 82  Resp: 14 18  Temp:  98.4 F (36.9 C)    Constitutional: Alert and oriented. Well appearing and in no acute distress. Eyes: Conjunctivae are normal. PERRL. EOMI. Head: Atraumatic. Nose: No congestion/rhinnorhea. Mouth/Throat: Mucous membranes are moist.  Oropharynx non-erythematous. Neck: No stridor. Painless ROM. No cervical spine tenderness to palpation Hematological/Lymphatic/Immunilogical: No cervical lymphadenopathy. Cardiovascular: Normal rate, regular rhythm. Grossly normal heart sounds.  Good peripheral circulation. Respiratory: Normal respiratory effort.  No retractions. Lungs CTAB. Gastrointestinal: Soft and nontender. No distention. No abdominal bruits. No CVA tenderness. Genitourinary:  Musculoskeletal: No lower extremity tenderness nor edema.  No joint effusions. Neurologic:  CN- intact.  No facial droop, Normal FNF.  Normal heel to shin.  Sensation intact bilaterally. Normal speech and language. No gross focal neurologic deficits are appreciated. No gait instability.  Skin:  Skin is warm, dry and intact. No rash noted. Psychiatric: Mood and affect are normal. Speech and behavior are normal.  ____________________________________________   LABS (all labs ordered are listed, but only abnormal results are displayed)  Results for orders placed or performed during the hospital encounter of 08/24/16 (from the past 24 hour(s))  Basic metabolic panel     Status: Abnormal   Collection Time: 08/24/16  7:21 PM  Result Value Ref Range   Sodium 134 (L) 135 - 145 mmol/L   Potassium 3.3 (L) 3.5 - 5.1 mmol/L   Chloride 100 (L) 101 - 111 mmol/L   CO2 26 22 - 32 mmol/L   Glucose, Bld 177 (H) 65 - 99 mg/dL   BUN 8 6 - 20 mg/dL   Creatinine, Ser 1.610.99 0.61 - 1.24 mg/dL     Calcium 09.610.0 8.9 - 10.3 mg/dL   GFR calc non Af Amer >60 >60 mL/min   GFR calc Af Amer >60 >60 mL/min   Anion gap 8 5 - 15  CBC     Status: Abnormal   Collection Time: 08/24/16  7:21 PM  Result Value Ref Range   WBC 8.9 3.8 - 10.6 K/uL   RBC 6.10 (H) 4.40 - 5.90 MIL/uL   Hemoglobin 17.3 13.0 - 18.0 g/dL   HCT 04.549.8 40.940.0 - 81.152.0 %   MCV 81.7 80.0 - 100.0 fL   MCH 28.4 26.0 - 34.0 pg   MCHC 34.8 32.0 - 36.0 g/dL   RDW 91.413.2 78.211.5 - 95.614.5 %   Platelets 191 150 - 440 K/uL  Urinalysis, Complete w Microscopic     Status: Abnormal   Collection Time: 08/24/16  7:21 PM  Result Value Ref Range   Color, Urine COLORLESS (A) YELLOW   APPearance CLEAR (A) CLEAR   Specific Gravity, Urine 1.001 (L) 1.005 - 1.030   pH 8.0 5.0 - 8.0   Glucose, UA >=500 (A) NEGATIVE mg/dL   Hgb urine dipstick NEGATIVE NEGATIVE   Bilirubin Urine NEGATIVE NEGATIVE   Ketones, ur NEGATIVE NEGATIVE mg/dL   Protein, ur NEGATIVE NEGATIVE mg/dL   Nitrite NEGATIVE NEGATIVE   Leukocytes,  UA NEGATIVE NEGATIVE   RBC / HPF 0-5 0 - 5 RBC/hpf   WBC, UA 0-5 0 - 5 WBC/hpf   Bacteria, UA RARE (A) NONE SEEN   Squamous Epithelial / LPF NONE SEEN NONE SEEN  Troponin I     Status: None   Collection Time: 08/24/16  7:21 PM  Result Value Ref Range   Troponin I <0.03 <0.03 ng/mL  Glucose, capillary     Status: Abnormal   Collection Time: 08/25/16 12:17 AM  Result Value Ref Range   Glucose-Capillary 162 (H) 65 - 99 mg/dL   ____________________________________________  EKG My review and personal interpretation at Time: 19:22   Indication: syncope  Rate: 95  Rhythm: sinus Axis: normal Other: non specific st and t wave changes, no STEMI ____________________________________________  RADIOLOGY  I personally reviewed all radiographic images ordered to evaluate for the above acute complaints and reviewed radiology reports and findings.  These findings were personally discussed with the patient.  Please see medical record for  radiology report.  ____________________________________________   PROCEDURES  Procedure(s) performed:  Procedures    Critical Care performed: no ____________________________________________   INITIAL IMPRESSION / ASSESSMENT AND PLAN / ED COURSE  Pertinent labs & imaging results that were available during my care of the patient were reviewed by me and considered in my medical decision making (see chart for details).  DDX: Dehydration, ACS, dysrhythmia, SAH, migraine, noncompliance  Rowin Bayron is a 46 y.o. who presents to the ED with above complaints. He is afebrile and otherwise hemodynamically stable. Patient with multiple complaints that seem difficult to completely. Will order CT head to evaluate for any evidence of subarachnoid bili is no meningismus at this time. Have a lower suspicion for [facility] but would be within the time window for highly sensitive CT scan to pick up any component. EKG with some nonspecific changes. Patient noncompliant with his medications and given his stent I am concern for primary cardiac etiology for his episode today. Will provide IV fluids as well as symptomatic treatment.  Pending a negative CT scan a do feel the patient will require admission to hospital for further risk stratification and reinitiation of his medications.  Clinical Course as of Aug 25 17  Tue Aug 24, 2016  2146 CT head with NAICA.    [PR]  2149 Spoke with Dr. Carloyn Jaeger group kindly agrees to admit patient for serial enzymes and further evaluation and management.  [PR]    Clinical Course User Index [PR] Willy Eddy, MD     ____________________________________________   FINAL CLINICAL IMPRESSION(S) / ED DIAGNOSES  Final diagnoses:  Near syncope  Chest pain, unspecified type  Nonintractable headache, unspecified chronicity pattern, unspecified headache type      NEW MEDICATIONS STARTED DURING THIS VISIT:  Current Discharge Medication List       Note:   This document was prepared using Dragon voice recognition software and may include unintentional dictation errors.    Willy Eddy, MD 08/25/16 Rich Fuchs

## 2016-08-25 ENCOUNTER — Observation Stay
Admit: 2016-08-25 | Discharge: 2016-08-25 | Disposition: A | Discharge status: Home / Self Care | Payer: Self-pay | Attending: Internal Medicine | Admitting: Internal Medicine

## 2016-08-25 LAB — CBC
HEMATOCRIT: 43.7 % (ref 40.0–52.0)
HEMOGLOBIN: 15.2 g/dL (ref 13.0–18.0)
MCH: 28.6 pg (ref 26.0–34.0)
MCHC: 34.8 g/dL (ref 32.0–36.0)
MCV: 82.3 fL (ref 80.0–100.0)
Platelets: 168 10*3/uL (ref 150–440)
RBC: 5.32 MIL/uL (ref 4.40–5.90)
RDW: 13.5 % (ref 11.5–14.5)
WBC: 8 10*3/uL (ref 3.8–10.6)

## 2016-08-25 LAB — GLUCOSE, CAPILLARY
GLUCOSE-CAPILLARY: 179 mg/dL — AB (ref 65–99)
GLUCOSE-CAPILLARY: 146 mg/dL — AB (ref 65–99)
GLUCOSE-CAPILLARY: 213 mg/dL — AB (ref 65–99)
Glucose-Capillary: 162 mg/dL — ABNORMAL HIGH (ref 65–99)
Glucose-Capillary: 270 mg/dL — ABNORMAL HIGH (ref 65–99)

## 2016-08-25 LAB — ECHOCARDIOGRAM COMPLETE
HEIGHTINCHES: 70 in
Weight: 3424 oz

## 2016-08-25 LAB — BASIC METABOLIC PANEL
Anion gap: 6 (ref 5–15)
BUN: 9 mg/dL (ref 6–20)
CO2: 26 mmol/L (ref 22–32)
Calcium: 8.6 mg/dL — ABNORMAL LOW (ref 8.9–10.3)
Chloride: 106 mmol/L (ref 101–111)
Creatinine, Ser: 0.9 mg/dL (ref 0.61–1.24)
Glucose, Bld: 172 mg/dL — ABNORMAL HIGH (ref 65–99)
POTASSIUM: 3.2 mmol/L — AB (ref 3.5–5.1)
Sodium: 138 mmol/L (ref 135–145)

## 2016-08-25 LAB — TROPONIN I
Troponin I: <0.03 ng/mL (ref <0.03)
Troponin I: <0.03 ng/mL (ref <0.03)
Troponin I: <0.03 ng/mL (ref <0.03)

## 2016-08-25 MED ORDER — LISINOPRIL 20 MG PO TABS
20.0000 mg | ORAL_TABLET | Freq: Every day | ORAL | Status: DC
  Administered 2016-08-25 – 2016-08-26 (×2): 20 mg via ORAL
  Filled 2016-08-25 (×2): qty 1

## 2016-08-25 MED ORDER — ASPIRIN EC 81 MG PO TBEC: 81.0000 mg | DELAYED_RELEASE_TABLET | Freq: Every day | ORAL | Status: DC

## 2016-08-25 MED ORDER — PRAVASTATIN SODIUM 40 MG PO TABS
40.0000 mg | ORAL_TABLET | Freq: Every day | ORAL | Status: DC
  Administered 2016-08-26: 40 mg via ORAL
  Filled 2016-08-25: qty 1

## 2016-08-25 MED ORDER — METOPROLOL SUCCINATE ER 25 MG PO TB24
25.0000 mg | ORAL_TABLET | Freq: Every day | ORAL | Status: DC
  Administered 2016-08-25 – 2016-08-26 (×2): 25 mg via ORAL
  Filled 2016-08-25 (×2): qty 1

## 2016-08-25 MED ORDER — CLOPIDOGREL BISULFATE 75 MG PO TABS: 75.0000 mg | ORAL_TABLET | Freq: Every day | ORAL | Status: DC

## 2016-08-25 MED ORDER — INSULIN DETEMIR 100 UNIT/ML ~~LOC~~ SOLN
10.0000 [IU] | Freq: Every day | SUBCUTANEOUS | Status: DC
  Administered 2016-08-25: 10 [IU] via SUBCUTANEOUS
  Filled 2016-08-25 (×2): qty 0.1

## 2016-08-25 MED ORDER — GABAPENTIN 300 MG PO CAPS: 300.0000 mg | ORAL_CAPSULE | Freq: Three times a day (TID) | ORAL | Status: DC

## 2016-08-25 NOTE — Progress Notes (Signed)
[employer] - [place] [facility]   PATIENT NAME: Scott MussMatthew [name]    MR#:  [id number]  DATE OF BIRTH:  [birth year]  SUBJECTIVE:   Patient here with near syncope, atypical chest pain. Not noted to be orthostatic. Family at bedside. No other new complaints. Seen by cardiology and plan for nuclear medicine stress test tomorrow.  REVIEW OF SYSTEMS:    Review of Systems  Constitutional: Negative for chills and fever.  HENT: Negative for congestion and tinnitus.   Eyes: Negative for blurred vision and double vision.  Respiratory: Negative for cough, shortness of breath and wheezing.   Cardiovascular: Negative for chest pain, orthopnea and PND.  Gastrointestinal: Negative for abdominal pain, diarrhea, nausea and vomiting.  Genitourinary: Negative for dysuria and hematuria.  Neurological: Negative for dizziness, sensory change and focal weakness.  All other systems reviewed and are negative.   Nutrition: Heart Healthy/Carb control Tolerating Diet: Yes Tolerating PT: Await Eval.   DRUG ALLERGIES:   Allergies  Allergen Reactions  . Biaxin  [Clarithromycin]     CAUSE RASH  . Penicillins     CAUSE RASH  . Prednisone     FEEL DIZZY    VITALS:  Blood pressure 137/85, pulse 77, temperature 97.8 F (36.6 C), temperature source Oral, resp. rate 18, height 5\' 10"  (1.778 m), weight 97.1 kg (214 lb), SpO2 96 %.  PHYSICAL EXAMINATION:   Physical Exam  GENERAL:  46 y.o.-year-old patient lying in the bed with no acute distress.  EYES: Pupils equal, round, reactive to light and accommodation. No scleral icterus. Extraocular muscles intact.  HEENT: Head atraumatic, normocephalic. Oropharynx and nasopharynx clear.  NECK:  Supple, no jugular venous distention. No thyroid enlargement, no tenderness.  LUNGS: Normal breath sounds bilaterally, no wheezing, rales, rhonchi. No use of accessory muscles of respiration.  CARDIOVASCULAR: S1, S2 normal. No murmurs, rubs, or gallops.   ABDOMEN: Soft, nontender, nondistended. Bowel sounds present. No organomegaly or mass.  EXTREMITIES: No cyanosis, clubbing or edema b/l.    NEUROLOGIC: Cranial nerves II through XII are intact. No focal Motor or sensory deficits b/l.   PSYCHIATRIC: The patient is alert and oriented x 3.  SKIN: No obvious rash, lesion, or ulcer.    LABORATORY PANEL:   CBC  Recent Labs Lab 08/25/16 0122  WBC 8.0  HGB 15.2  HCT 43.7  PLT 168   ------------------------------------------------------------------------------------------------------------------  Chemistries   Recent Labs Lab 08/25/16 0122  NA 138  K 3.2*  CL 106  CO2 26  GLUCOSE 172*  BUN 9  CREATININE 0.90  CALCIUM 8.6*   ------------------------------------------------------------------------------------------------------------------  Cardiac Enzymes  Recent Labs Lab 08/25/16 1319  TROPONINI <0.03   ------------------------------------------------------------------------------------------------------------------  RADIOLOGY:  Dg Chest 2 View  Result Date: 08/24/2016 CLINICAL DATA:  Syncope. EXAM: CHEST  2 VIEW COMPARISON:  11/15/2015 FINDINGS: The cardiomediastinal contours are normal. The lungs are clear. Pulmonary vasculature is normal. No consolidation, pleural effusion, or pneumothorax. No acute osseous abnormalities are seen. IMPRESSION: No acute abnormality. Electronically Signed   By: Rubye OaksMelanie  Ehinger M.D.   On: 08/24/2016 21:16   Ct Head Wo Contrast  Result Date: 08/24/2016 CLINICAL DATA:  Initial encounter for Pt arrives to ER via [facility] from home for possible syncope and fall. Pt reports he felt dizzy and remembers falling forward but does remember falling down. Pt found by wife. Pt c/o headache that began earlier today 8/10. EXAM: CT HEAD WITHOUT CONTRAST TECHNIQUE: Contiguous axial images were obtained from the base of the skull through  the vertex without intravenous contrast. COMPARISON:  04/30/2009  FINDINGS: Brain: No mass lesion, hemorrhage, hydrocephalus, acute infarct, intra-axial, or extra-axial fluid collection. Vascular: No hyperdense vessel or unexpected calcification. Skull: No significant soft tissue swelling.  No skull fracture. Sinuses/Orbits: Normal orbits and globes. Incompletely imaged left maxillary sinus mucous retention cyst or polyp. Clear mastoid air cells. Other: None. IMPRESSION: No acute intracranial abnormality. Electronically Signed   By: Jeronimo Greaves M.D.   On: 08/24/2016 20:49     ASSESSMENT AND PLAN:   46 year old male with past medical history of diabetes, hypertension, history of coronary disease status post an placement, hyperlipidemia presents to the hospital due to near-syncope atypical chest pain.  1. Near syncope-etiology unclear. Cardiac markers 3 have been negative, no evidence of orthostasis, CT head negative, no arrhythmias on telemetry. -No further episodes since being in the hospital.  2. Atypical chest pain-patient is noncompliant with his medications over the past few months. His cardiac markers so far negative. -He does have significant risk factors given previous history of coronary disease and not compliant with his medications. -Appreciate cardiology input and plan for nuclear medicine stress test tomorrow.  -continue aspirin, Plavix, beta blocker, statin.  3. Diabetes type 2 without complication-continue Levemir, sliding scale insulin.  4. Essential hypertension-continue Toprol, lisinopril.  5. Diabetic neuropathy-continue gabapentin.  All the records are reviewed and case discussed with Care Management/Social Worker. Management plans discussed with the patient, family and they are in agreement.  CODE STATUS: Full  DVT Prophylaxis: Lovenox  TOTAL TIME TAKING CARE OF THIS PATIENT: 30 minutes.   POSSIBLE D/C IN 1-2 DAYS, DEPENDING ON CLINICAL CONDITION.   Houston Siren M.D on 08/25/2016 at 3:38 PM  Between 7am to 6pm - Pager -  [phone]  After 6pm go to [email or web address] - Therapist, [facility] Hospital[facility]  Office  [phone]  CC: Primary care physician; [facility]

## 2016-08-25 NOTE — Progress Notes (Signed)
*  PRELIMINARY RESULTS* Echocardiogram 2D Echocardiogram has been performed.  Cristela BlueHege, Brentin Shin 08/25/2016, 3:20 PM

## 2016-08-25 NOTE — Consult Note (Signed)
Reason for Consult: Syncope chest pain coronary disease Referring Physician: Dr. Lance Coon hospitalist, Veda Canning primary  Scott [name] is an 46 y.o. male.  HPI: Patient's a 46 year old male known coronary disease history of PCI and stent May 2017 at [facility] 2 days of diabetes obesity hypertension hyperlipidemia GERD complaining of chest pain symptoms over the last 2 weeks and had a syncopal episode yesterday. Patient had rescue and EMS called and they had evaluation of his blood pressure being elevated but EKG was nondiagnostic he was transported to the hospital complaining of severe headaches. Patient states she's quit taking all his medicines back in August. He complained of muscle aches and weakness and he wasn't sure which one was positive so he stopped everything. Patient has not follow-up with cardiology since July  Past Medical History:  Diagnosis Date  . Coronary artery disease involving coronary bypass graft of native heart without angina pectoris   . Diabetes mellitus without complication ([facility])   . Elevated liver enzymes   . GERD (gastroesophageal reflux disease)   . Hyperlipidemia   . Hypertension     Past Surgical History:  Procedure Laterality Date  . APPENDECTOMY    . CARDIAC CATHETERIZATION  07/31/2007    Family History  Problem Relation Age of Onset  . Cancer Mother     brain cancer  . Coronary artery disease Father   . Heart attack Father     Social History:  reports that he has quit smoking. His smoking use included Cigarettes and E-cigarettes. He does not have any smokeless tobacco history on file. He reports that he does not drink alcohol or use drugs.  Allergies:  Allergies  Allergen Reactions  . Biaxin  [Clarithromycin]     CAUSE RASH  . Penicillins     CAUSE RASH  . Prednisone     FEEL DIZZY    Medications: I have reviewed the patient's current medications.  Results for orders placed or performed during the hospital encounter of 08/24/16 (from  the past 48 hour(s))  Basic metabolic panel     Status: Abnormal   Collection Time: 08/24/16  7:21 PM  Result Value Ref Range   Sodium 134 (L) 135 - 145 mmol/L   Potassium 3.3 (L) 3.5 - 5.1 mmol/L   Chloride 100 (L) 101 - 111 mmol/L   CO2 26 22 - 32 mmol/L   Glucose, Bld 177 (H) 65 - 99 mg/dL   BUN 8 6 - 20 mg/dL   Creatinine, Ser 0.99 0.61 - 1.24 mg/dL   Calcium 10.0 8.9 - 10.3 mg/dL   GFR calc non Af Amer >60 >60 mL/min   GFR calc Af Amer >60 >60 mL/min    Comment: (NOTE) The eGFR has been calculated using the CKD EPI equation. This calculation has not been validated in all clinical situations. eGFR's persistently <60 mL/min signify possible Chronic Kidney Disease.    Anion gap 8 5 - 15  CBC     Status: Abnormal   Collection Time: 08/24/16  7:21 PM  Result Value Ref Range   WBC 8.9 3.8 - 10.6 K/uL   RBC 6.10 (H) 4.40 - 5.90 MIL/uL   Hemoglobin 17.3 13.0 - 18.0 g/dL   HCT 49.8 40.0 - 52.0 %   MCV 81.7 80.0 - 100.0 fL   MCH 28.4 26.0 - 34.0 pg   MCHC 34.8 32.0 - 36.0 g/dL   RDW 13.2 11.5 - 14.5 %   Platelets 191 150 - 440 K/uL  Urinalysis, Complete  w Microscopic     Status: Abnormal   Collection Time: 08/24/16  7:21 PM  Result Value Ref Range   Color, Urine COLORLESS (A) YELLOW   APPearance CLEAR (A) CLEAR   Specific Gravity, Urine 1.001 (L) 1.005 - 1.030   pH 8.0 5.0 - 8.0   Glucose, UA >=500 (A) NEGATIVE mg/dL   Hgb urine dipstick NEGATIVE NEGATIVE   Bilirubin Urine NEGATIVE NEGATIVE   Ketones, ur NEGATIVE NEGATIVE mg/dL   Protein, ur NEGATIVE NEGATIVE mg/dL   Nitrite NEGATIVE NEGATIVE   Leukocytes, UA NEGATIVE NEGATIVE   RBC / HPF 0-5 0 - 5 RBC/hpf   WBC, UA 0-5 0 - 5 WBC/hpf   Bacteria, UA RARE (A) NONE SEEN   Squamous Epithelial / LPF NONE SEEN NONE SEEN  Troponin I     Status: None   Collection Time: 08/24/16  7:21 PM  Result Value Ref Range   Troponin I <0.03 <0.03 ng/mL  Glucose, capillary     Status: Abnormal   Collection Time: 08/25/16 12:17 AM   Result Value Ref Range   Glucose-Capillary 162 (H) 65 - 99 mg/dL  Basic metabolic panel     Status: Abnormal   Collection Time: 08/25/16  1:22 AM  Result Value Ref Range   Sodium 138 135 - 145 mmol/L   Potassium 3.2 (L) 3.5 - 5.1 mmol/L   Chloride 106 101 - 111 mmol/L   CO2 26 22 - 32 mmol/L   Glucose, Bld 172 (H) 65 - 99 mg/dL   BUN 9 6 - 20 mg/dL   Creatinine, Ser 0.90 0.61 - 1.24 mg/dL   Calcium 8.6 (L) 8.9 - 10.3 mg/dL   GFR calc non Af Amer >60 >60 mL/min   GFR calc Af Amer >60 >60 mL/min    Comment: (NOTE) The eGFR has been calculated using the CKD EPI equation. This calculation has not been validated in all clinical situations. eGFR's persistently <60 mL/min signify possible Chronic Kidney Disease.    Anion gap 6 5 - 15  CBC     Status: None   Collection Time: 08/25/16  1:22 AM  Result Value Ref Range   WBC 8.0 3.8 - 10.6 K/uL   RBC 5.32 4.40 - 5.90 MIL/uL   Hemoglobin 15.2 13.0 - 18.0 g/dL   HCT 43.7 40.0 - 52.0 %   MCV 82.3 80.0 - 100.0 fL   MCH 28.6 26.0 - 34.0 pg   MCHC 34.8 32.0 - 36.0 g/dL   RDW 13.5 11.5 - 14.5 %   Platelets 168 150 - 440 K/uL  Troponin I     Status: None   Collection Time: 08/25/16  1:22 AM  Result Value Ref Range   Troponin I <0.03 <0.03 ng/mL  Glucose, capillary     Status: Abnormal   Collection Time: 08/25/16  6:49 AM  Result Value Ref Range   Glucose-Capillary 179 (H) 65 - 99 mg/dL  Troponin I     Status: None   Collection Time: 08/25/16  7:48 AM  Result Value Ref Range   Troponin I <0.03 <0.03 ng/mL  Glucose, capillary     Status: Abnormal   Collection Time: 08/25/16 11:38 AM  Result Value Ref Range   Glucose-Capillary 146 (H) 65 - 99 mg/dL    Dg Chest 2 View  Result Date: 08/24/2016 CLINICAL DATA:  Syncope. EXAM: CHEST  2 VIEW COMPARISON:  11/15/2015 FINDINGS: The cardiomediastinal contours are normal. The lungs are clear. Pulmonary vasculature is normal. No consolidation, pleural  effusion, or pneumothorax. No acute osseous  abnormalities are seen. IMPRESSION: No acute abnormality. Electronically Signed   By: Jeb Levering M.D.   On: 08/24/2016 21:16   Ct Head Wo Contrast  Result Date: 08/24/2016 CLINICAL DATA:  Initial encounter for Pt arrives to ER via [facility] from home for possible syncope and fall. Pt reports he felt dizzy and remembers falling forward but does remember falling down. Pt found by wife. Pt c/o headache that began earlier today 8/10. EXAM: CT HEAD WITHOUT CONTRAST TECHNIQUE: Contiguous axial images were obtained from the base of the skull through the vertex without intravenous contrast. COMPARISON:  04/30/2009 FINDINGS: Brain: No mass lesion, hemorrhage, hydrocephalus, acute infarct, intra-axial, or extra-axial fluid collection. Vascular: No hyperdense vessel or unexpected calcification. Skull: No significant soft tissue swelling.  No skull fracture. Sinuses/Orbits: Normal orbits and globes. Incompletely imaged left maxillary sinus mucous retention cyst or polyp. Clear mastoid air cells. Other: None. IMPRESSION: No acute intracranial abnormality. Electronically Signed   By: Abigail Miyamoto M.D.   On: 08/24/2016 20:49    Review of Systems  Constitutional: Positive for malaise/fatigue.  HENT: Negative.   Eyes: Negative.   Respiratory: Positive for shortness of breath.   Cardiovascular: Positive for chest pain.  Gastrointestinal: Positive for heartburn.  Genitourinary: Negative.   Musculoskeletal: Positive for myalgias.  Skin: Negative.   Neurological: Positive for loss of consciousness and weakness.  Endo/Heme/Allergies: Negative.   Psychiatric/Behavioral: Negative.    Blood pressure 137/85, pulse 77, temperature 97.8 F (36.6 C), temperature source Oral, resp. rate 18, height 5' 10"  (1.778 m), weight 97.1 kg (214 lb), SpO2 96 %. Physical Exam  Nursing note and vitals reviewed. Constitutional: He is oriented to person, place, and time. He appears well-developed and well-nourished.  HENT:  Head:  Normocephalic and atraumatic.  Eyes: Conjunctivae and EOM are normal. Pupils are equal, round, and reactive to light.  Neck: Normal range of motion. Neck supple.  Cardiovascular: Normal rate, regular rhythm and normal heart sounds.   Respiratory: Effort normal and breath sounds normal.  GI: Soft. Bowel sounds are normal.  Musculoskeletal: Normal range of motion.  Neurological: He is alert and oriented to person, place, and time. He has normal reflexes.  Skin: Skin is warm and dry.  Psychiatric: He has a normal mood and affect.    Assessment/Plan:  Chest pain Coronary artery disease History of PCI x 2  stent May 2017 [facility] Diabetes Obesity GERD Hyperlipidemia Syncope Vertigo Noncompliance . Plan  Agree with admit to rule out for myocardial infarction Follow-up EKGs and troponins Consider echocardiogram for assessment of left ventricular function wall motion Proceed with functional study for therapy Patient on aspirin and Plavix for his stent Recommend beta blocker ACE inhibitor therapy Consider lipid management therapy possibly with Pravachol or zetia Maintain diabetes management and control with insulin Recommend weight loss exercise portion control Consider evaluation for obstructive sleep apnea with sleep study CPAP as needed Consider omeprazole Zantac for reflux symptoms A functional study in the morning is negative will discharge home the patient follow-up with his cardiologist emergency  Dwayne D [place] 08/25/2016, 1:12 PM

## 2016-08-25 NOTE — Progress Notes (Signed)
Patient transferred from the ER following c/o syncope episode and fall at home. Patient was ambulatory on admission, placed on fall precaution d/t syncope episode at home. Patient denied being in pain, oriented to the unit and kept NPO overnight per order.

## 2016-08-26 ENCOUNTER — Observation Stay: Payer: Self-pay

## 2016-08-26 LAB — NM MYOCAR MULTI W/SPECT W/WALL MOTION / EF
CHL CUP NUCLEAR SRS: 2
CSEPEDS: 3 s
Estimated workload: 1 METS
Exercise duration (min): 1 min
LVDIAVOL: 143 mL (ref 62–150)
LVSYSVOL: 63 mL
NUC STRESS TID: 1.18
Peak HR: 96 {beats}/min
Rest HR: 68 {beats}/min
SDS: 2
SSS: 1

## 2016-08-26 LAB — GLUCOSE, CAPILLARY
GLUCOSE-CAPILLARY: 132 mg/dL — AB (ref 65–99)
GLUCOSE-CAPILLARY: 172 mg/dL — AB (ref 65–99)
GLUCOSE-CAPILLARY: 178 mg/dL — AB (ref 65–99)

## 2016-08-26 LAB — HEMOGLOBIN A1C
Hgb A1c MFr Bld: 10.6 % — ABNORMAL HIGH (ref 4.8–5.6)
Mean Plasma Glucose: 258 mg/dL

## 2016-08-26 MED ORDER — PRAVASTATIN SODIUM 10 MG PO TABS: 10.0000 mg | ORAL_TABLET | Freq: Every day | ORAL | 1 refills | Status: DC

## 2016-08-26 MED ORDER — REGADENOSON 0.4 MG/5ML IV SOLN
0.4000 mg | Freq: Once | INTRAVENOUS | Status: AC
  Administered 2016-08-26: 0.4 mg via INTRAVENOUS

## 2016-08-26 MED ORDER — LIVING WELL WITH DIABETES BOOK
Freq: Once | Status: AC
  Administered 2016-08-26: 13:00:00
  Filled 2016-08-26: qty 1

## 2016-08-26 MED ORDER — INSULIN DETEMIR 100 UNIT/ML ~~LOC~~ SOLN: 15.0000 [IU] | Freq: Every day | SUBCUTANEOUS | 11 refills | Status: DC

## 2016-08-26 MED ORDER — TECHNETIUM TC 99M TETROFOSMIN IV KIT
30.0000 | PACK | Freq: Once | INTRAVENOUS | Status: AC | PRN
  Administered 2016-08-26: 28.1 via INTRAVENOUS

## 2016-08-26 MED ORDER — TECHNETIUM TC 99M TETROFOSMIN IV KIT
13.0000 | PACK | Freq: Once | INTRAVENOUS | Status: AC | PRN
  Administered 2016-08-26: 12.714 via INTRAVENOUS

## 2016-08-26 NOTE — Plan of Care (Signed)
Problem: Not Ready for Diet/Lifestyle Change (NB-1.3) Goal: Nutrition education Formal process to instruct or train a patient/client in a skill or to impart knowledge to help patients/clients voluntarily manage or modify food choices and eating behavior to maintain or improve health. Outcome: Completed/Met Date Met: 08/26/16  RD consulted for nutrition education regarding diabetes.   Lab Results  Component Value Date   HGBA1C 10.6 (H) 08/25/2016    RD provided "Carbohydrate Counting for People with Diabetes" handout from the Academy of Nutrition and Dietetics. Discussed different food groups and their effects on blood sugar, emphasizing carbohydrate-containing foods. Provided list of carbohydrates and recommended serving sizes of common foods.  Discussed importance of controlled and consistent carbohydrate intake throughout the day. Provided examples of ways to balance meals/snacks and encouraged intake of high-fiber, whole grain complex carbohydrates. Teach back method used.  Expect fair compliance.  Body mass index is 30.71 kg/m. Pt meets criteria for obese based on current BMI.  Current diet order is heart healthy/carb mod, patient is consuming approximately 100% of meals at this time. Labs and medications reviewed. No further nutrition interventions warranted at this time. RD contact information provided. If additional nutrition issues arise, please re-consult RD.  Satira Anis. Philemon Riedesel, MS, RD LDN Inpatient Clinical Dietitian Pager [phone]

## 2016-08-26 NOTE — Care Management (Signed)
Patient placed in observation for chest pain.  Known cardiac disease. No insurance.  Patient is followed at [facility].  Independent in all adls, denies issues accessing medical care, obtaining medications or with transportation.  Current with PCP.  He is firm in his statement in front of his parents that he has no financial issues that would prevent him from obtaining his medications or obtaining medical care.

## 2016-08-26 NOTE — Plan of Care (Signed)
Problem: Safety: Goal: Ability to remain free from injury will improve Outcome: Progressing Fall precautions in place  Problem: Pain Managment: Goal: General experience of comfort will improve Outcome: Not Progressing Prn medications  Problem: Tissue Perfusion: Goal: Risk factors for ineffective tissue perfusion will decrease Outcome: Progressing SQ Lovenox

## 2016-08-26 NOTE — Progress Notes (Addendum)
Inpatient Diabetes Program Recommendations  AACE/ADA: New Consensus Statement on Inpatient Glycemic Control (2015)  Target Ranges:  Prepandial:   less than 140 mg/dL      Peak postprandial:   less than 180 mg/dL (1-2 hours)      Critically ill patients:  140 - 180 mg/dL   Lab Results  Component Value Date   GLUCAP 172 (H) 08/26/2016   HGBA1C 10.6 (H) 08/25/2016    Review of Glycemic Control  Results for [name], Scott (MRN [id number]) as of 08/26/2016 09:34  Ref. Range 08/25/2016 11:38 08/25/2016 18:06 08/25/2016 21:33 08/26/2016 00:28 08/26/2016 05:58  Glucose-Capillary Latest Ref Range: 65 - 99 mg/dL 409146 (H) 811270 (H) 914213 (H) 178 (H) 172 (H)    Diabetes history: Type 2 Outpatient Diabetes medications:Levemir 10 units qhs Current orders for Inpatient glycemic control: Levemir 10 units qhs, Novolog 0-9 units q6h  Inpatient Diabetes Program Recommendations:  Once the patient is back from his procedure, please change him to Novolog 0-15 units tid, Novolog 0-5 units qhs  Consider increasing Levemir to 15 units qhs (0.15units/kg)   * ordered Living Well with Diabetes    Susette RacerJulie Taysen Bushart, RN, BA, [place]HA, CDE Diabetes Coordinator Inpatient Diabetes Program  [phone] (Team Pager) [phone] [facility] Office) 08/26/2016 9:36 AM

## 2016-08-26 NOTE — Progress Notes (Signed)
Pt discharged to home via wc.  Instructions and rx given to pt.  Questions answered.  No distress.  

## 2016-08-26 NOTE — Progress Notes (Signed)
Inpatient Diabetes Program Recommendations  AACE/ADA: New Consensus Statement on Inpatient Glycemic Control (2015)  Target Ranges:  Prepandial:   less than 140 mg/dL      Peak postprandial:   less than 180 mg/dL (1-2 hours)      Critically ill patients:  140 - 180 mg/dL   Lab Results  Component Value Date   GLUCAP 132 (H) 08/26/2016   HGBA1C 10.6 (H) 08/25/2016    Spoke with patient, his wife and his in-laws about his recent admission.  He has received diabetes education in the past;  at his company [employer] and at the [facility] about 15 years ago.  He was able to tell me he should eat 45-60 grams of carbohydrate per meal.    He admits to not taking his insulin for about 6 months (although he has access and can afford the sliding scale fee charged at [facility]).  He last saw MD in the summer when his A1C was 9% at that time.  He thought his medications made him feel bad -so he stopped them- I asked him to consider that perhaps the high blood sugars made him feel bad.    Knows what an A1C is- discussed potential long term complications, high fat choices, excessive sweet tea intake.  Discussed his previous stent and the benefit of a low fat diet with a controlled number of carbohydrates.  Since the [facility] classes may be cost prohibitive, suggest following up with [facility] if they have a dietitian and have asked a dietitian see him while he is inpatient.   Receptive to the information, asked appropriate questions.  Shaken with this admission since they have a [age] and 46 year old.   Recommend folllow up with MD and to take medications as ordered.   Susette RacerJulie Manjot Beumer, RN, BA, MHA, CDE Diabetes Coordinator Inpatient Diabetes Program  [phone] (Team Pager) [phone] [facility] Office) 08/26/2016 2:40 PM

## 2016-08-26 NOTE — Progress Notes (Signed)
To nuclear medicine via bed 

## 2016-08-26 NOTE — Progress Notes (Signed)
Back from Nuclear medicine 

## 2016-08-27 NOTE — Discharge Summary (Signed)
[employer] [facility] - [place] at [facility]   PATIENT NAME: Scott [name]    MR#:  [id number]  DATE OF BIRTH:  June 10, 1971  DATE OF ADMISSION:  08/24/2016 ADMITTING PHYSICIAN: Oralia Manis, MD  DATE OF DISCHARGE: 08/26/2016  5:23 PM  PRIMARY CARE PHYSICIAN: [facility]    ADMISSION DIAGNOSIS:  Syncope   DISCHARGE DIAGNOSIS:  Principal Problem:   Angina pectoris (HCC) Active Problems:   CAD in native artery   Gastro-esophageal reflux disease without esophagitis   HLD (hyperlipidemia)   BP (high blood pressure)   Diabetes mellitus type 2, uncontrolled (HCC)   SECONDARY DIAGNOSIS:   Past Medical History:  Diagnosis Date  . Coronary artery disease involving coronary bypass graft of native heart without angina pectoris   . Diabetes mellitus without complication ([facility])   . Elevated liver enzymes   . GERD (gastroesophageal reflux disease)   . Hyperlipidemia   . Hypertension     HOSPITAL COURSE:   46 year old male with past medical history of diabetes, hypertension, history of coronary disease status post an placement, hyperlipidemia presents to the hospital due to near-syncope atypical chest pain.  1. Near syncope- vasovagal in nature.  - pt. Was Observed on telemetry, Cardiac markers 3 have been negative, no evidence of orthostasis, CT head negative, no arrhythmias on telemetry.Echo showed normal EF w/ no acute cardiac source of emboli or wall motion abnormalities.  -No further episodes since being in the hospital and stable for discharge.    2. Atypical chest pain-patient is noncompliant with his medications over the past few months. His cardiac markers were negative.  -He does have significant risk factors given previous history of coronary disease and not compliant with his medications, he was seen by cardiology and recommended a nuclear medicine stress test. 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 Cardiology recommended medical management and patient was discharged on resuming his medications including aspirin, Plavix, beta blocker and statin and follow up with them as outpatient. Patient was chest pain-free and hemodynamically stable upon discharge.  3. Diabetes type 2 without complication- on the hospital patient was on Levemir and sliding scale insulin, he will resume his Levemir upon discharge was strongly advised to be compliant with his meds.  4. Essential hypertension- he  will continue Toprol, lisinopril.  5. Diabetic neuropathy- he will continue gabapentin  DISCHARGE CONDITIONS:   Stable.   CONSULTS OBTAINED:  Treatment Team:  Alwyn Pea, MD  DRUG ALLERGIES:   Allergies  Allergen Reactions  . Biaxin  [Clarithromycin]     CAUSE RASH  . Penicillins     CAUSE RASH  . Prednisone     FEEL DIZZY    DISCHARGE MEDICATIONS:   Allergies as of 08/26/2016      Reactions   Biaxin  [clarithromycin]    CAUSE RASH   Penicillins    CAUSE RASH   Prednisone    FEEL DIZZY      Medication List    STOP taking these medications   atorvastatin 80 MG tablet Commonly known as:  LIPITOR     TAKE these medications   aspirin EC 81 MG tablet Take 81 mg by mouth daily.   clopidogrel 75 MG tablet Commonly known as:  PLAVIX Take 75 mg by mouth daily.   gabapentin 300 MG capsule Commonly known as:  NEURONTIN Take 300 mg by mouth 3 (three) times daily.   insulin detemir 100 UNIT/ML  injection Commonly known as:  LEVEMIR Inject 0.15 mLs (15 Units total) into the skin at bedtime. What changed:  how much to take   Insulin Pen Needle 32G X 4 MM Misc Commonly known as:  NOVOFINE PLUS 1 Device by Does not apply route daily.   lisinopril 20 MG tablet Commonly known as:  PRINIVIL,ZESTRIL Take 20 mg by mouth daily.   metoprolol succinate 25 MG 24 hr tablet Commonly known as:  TOPROL-XL Take 25 mg by mouth daily.   pravastatin 10 MG  tablet Commonly known as:  PRAVACHOL Take 1 tablet (10 mg total) by mouth daily. What changed:  how much to take  when to take this   ranitidine 150 MG capsule Commonly known as:  ZANTAC Take 150 mg by mouth 2 (two) times daily.         DISCHARGE INSTRUCTIONS:   DIET:  Cardiac diet and Diabetic diet  DISCHARGE CONDITION:  Stable  ACTIVITY:  Activity as tolerated  OXYGEN:  Home Oxygen: No.   Oxygen Delivery: room air  DISCHARGE LOCATION:  home   If you experience worsening of your admission symptoms, develop shortness of breath, life threatening emergency, suicidal or homicidal thoughts you must seek medical attention immediately by calling 911 or calling your MD immediately  if symptoms less severe.  You Must read complete instructions/literature along with all the possible adverse reactions/side effects for all the Medicines you take and that have been prescribed to you. Take any new Medicines after you have completely understood and accpet all the possible adverse reactions/side effects.   Please note  You were cared for by a hospitalist during your hospital stay. If you have any questions about your discharge medications or the care you received while you were in the hospital after you are discharged, you can call the unit and asked to speak with the hospitalist on call if the hospitalist that took care of you is not available. Once you are discharged, your primary care physician will handle any further medical issues. Please note that NO REFILLS for any discharge medications will be authorized once you are discharged, as it is imperative that you return to your primary care physician (or establish a relationship with a primary care physician if you do not have one) for your aftercare needs so that they can reassess your need for medications and monitor your lab values.     Today   No further episodes of syncope.  No new complaints.  No chest pain.    VITAL  SIGNS:  Blood pressure 126/85, pulse 66, temperature 98.2 F (36.8 C), temperature source Oral, resp. rate 18, height 5\' 10"  (1.778 m), weight 97.1 kg (214 lb), SpO2 96 %.  I/O:  No intake or output data in the 24 hours ending 08/27/16 1552  PHYSICAL EXAMINATION:  GENERAL:  46 y.o.-year-old patient lying in the bed with no acute distress.  EYES: Pupils equal, round, reactive to light and accommodation. No scleral icterus. Extraocular muscles intact.  HEENT: Head atraumatic, normocephalic. Oropharynx and nasopharynx clear.  NECK:  Supple, no jugular venous distention. No thyroid enlargement, no tenderness.  LUNGS: Normal breath sounds bilaterally, no wheezing, rales,rhonchi. No use of accessory muscles of respiration.  CARDIOVASCULAR: S1, S2 normal. No murmurs, rubs, or gallops.  ABDOMEN: Soft, non-tender, non-distended. Bowel sounds present. No organomegaly or mass.  EXTREMITIES: No pedal edema, cyanosis, or clubbing.  NEUROLOGIC: Cranial nerves II through XII are intact. No focal motor or sensory defecits b/l.  PSYCHIATRIC: The  patient is alert and oriented x 3.  SKIN: No obvious rash, lesion, or ulcer.   DATA REVIEW:   CBC  Recent Labs Lab 08/25/16 0122  WBC 8.0  HGB 15.2  HCT 43.7  PLT 168    Chemistries   Recent Labs Lab 08/25/16 0122  NA 138  K 3.2*  CL 106  CO2 26  GLUCOSE 172*  BUN 9  CREATININE 0.90  CALCIUM 8.6*    Cardiac Enzymes  Recent Labs Lab 08/25/16 1319  TROPONINI <0.03      RADIOLOGY:  Nm Myocar Multi W/spect W/wall Motion / Ef  Result Date: 08/26/2016  Blood pressure demonstrated a normal response to exercise.  There was no ST segment deviation noted during stress.  Defect 1: There is a small defect of mild severity present in the apex location.  The left ventricular ejection fraction is moderately decreased (30-44%).  Findings consistent with prior myocardial infarction.  This is a low risk study.       Management plans  discussed with the patient, family and they are in agreement.  CODE STATUS:  Code Status History    Date Active Date Inactive Code Status Order ID Comments User Context   08/24/2016 11:55 PM 08/26/2016  8:25 PM Full Code [id number]  Oralia Manis, MD ED      TOTAL TIME TAKING CARE OF THIS PATIENT: 40 minutes.    Houston Siren M.D on 08/27/2016 at 3:52 PM  Between 7am to 6pm - Pager - [phone]  After 6pm go to [email or web address] - Therapist, nutritional Hospitalists  Office  [phone]  CC: Primary care physician; [facility]

## 2018-03-03 IMAGING — CT CT HEAD W/O CM
3 series · 15 of 47 positions shown, 18 images · non-contrast
Comparison: 04/30/2009

CLINICAL DATA: Initial encounter for Pt arrives to ER via [facility]
from home for possible syncope and fall. Pt reports he felt dizzy
and remembers falling forward but does remember falling down. Pt
found by wife. Pt c/o headache that began earlier today [DATE].

EXAM:
CT HEAD WITHOUT CONTRAST
TECHNIQUE: Contiguous axial images were obtained from the base of the skull
through the vertex without intravenous contrast.

[Series 2: head wo · axial · 0.45mm/px · z∈[+131,+266]mm · 9 of 33 slices shown, 12 images]
[im 3/33  brain]
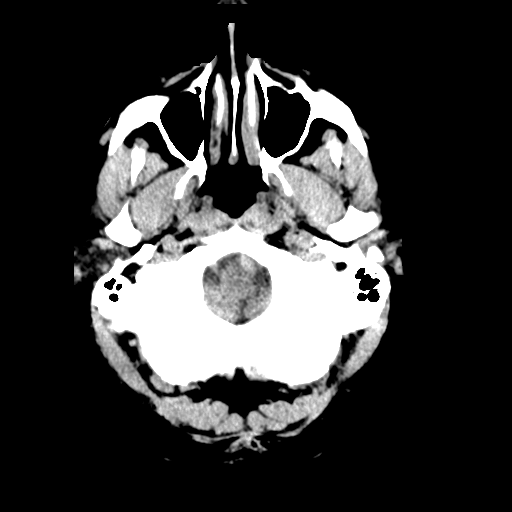
[im 3/33  bone]
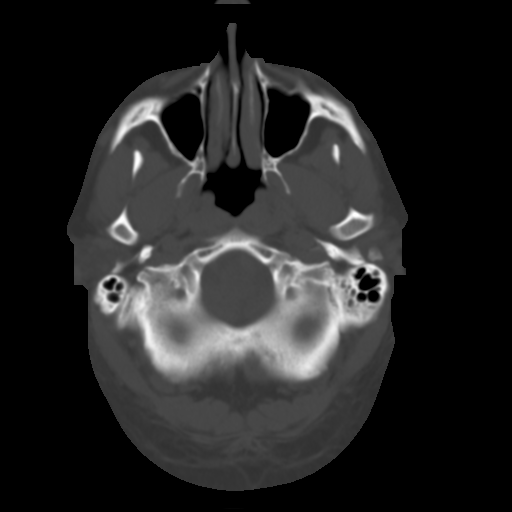
[im 6/33  brain]
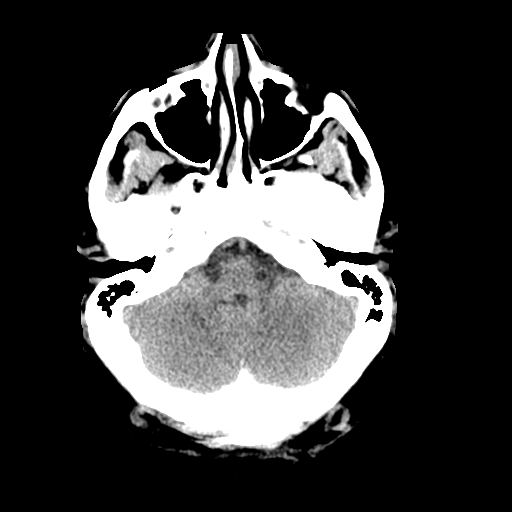
[im 9/33  brain]
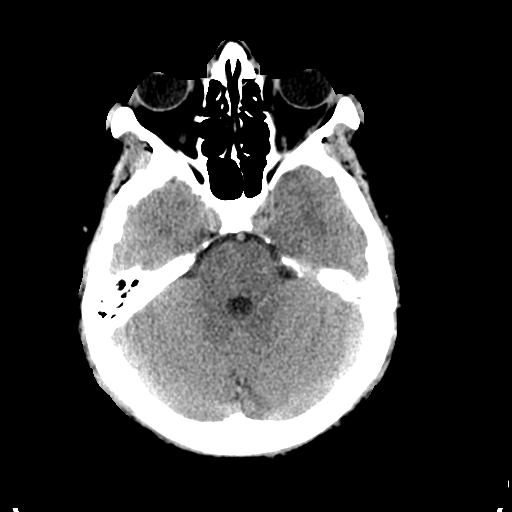
[im 13/33  brain]
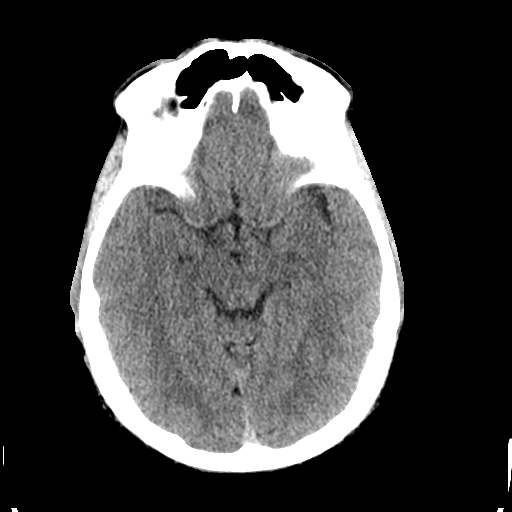
[im 17/33  brain]
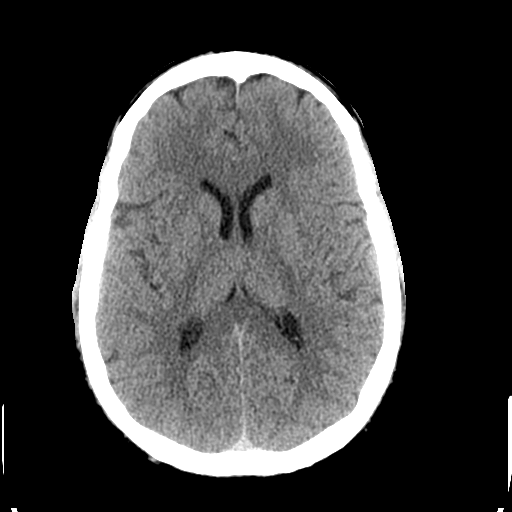
[im 17/33  bone]
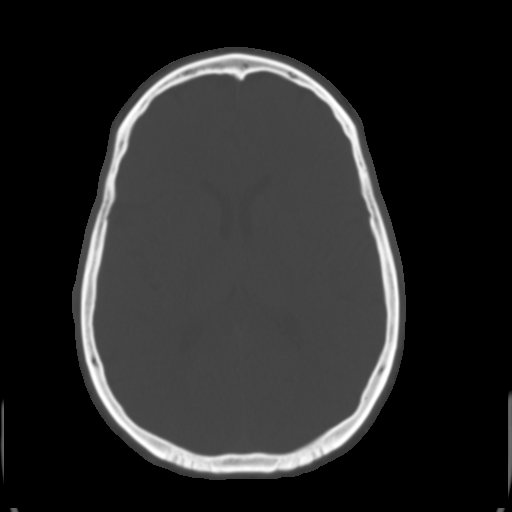
[im 20/33  brain]
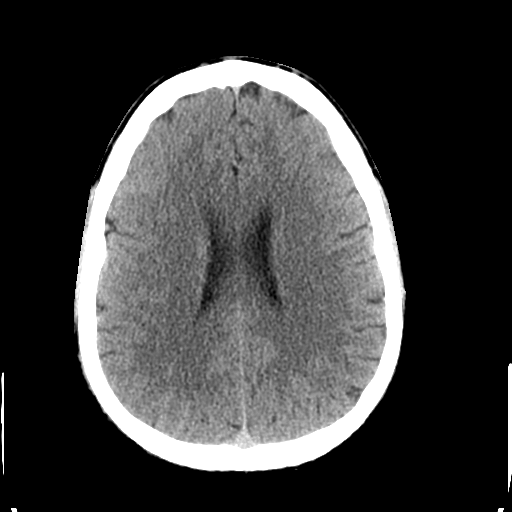
[im 24/33  brain]
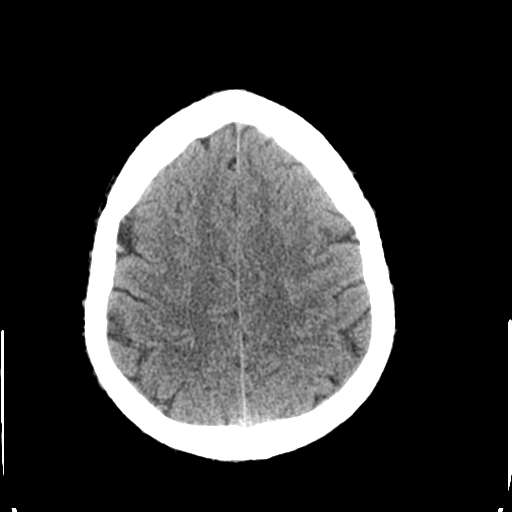
[im 27/33  brain]
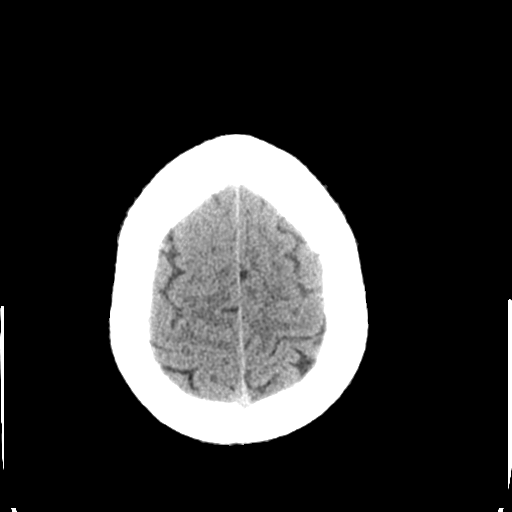
[im 30/33  brain]
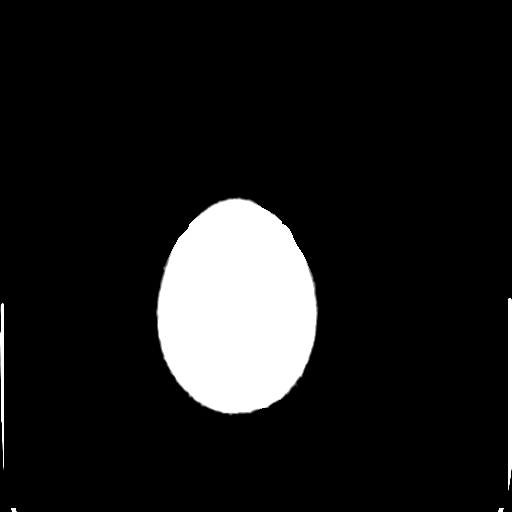
[im 30/33  bone]
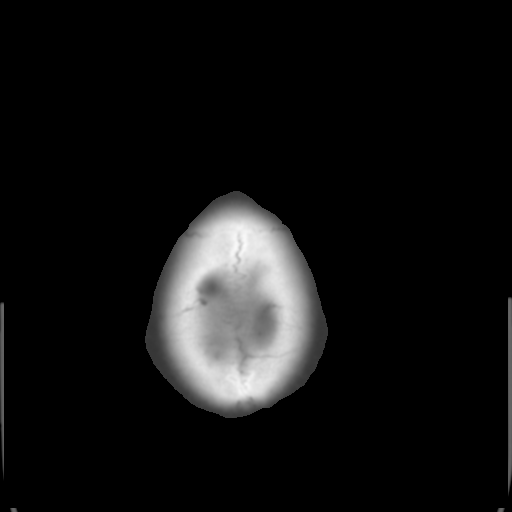

[Series 4: coronal soft tissue · coronal · 0.32mm/px · 3 of 66 slices shown]
[im 22/66  brain]
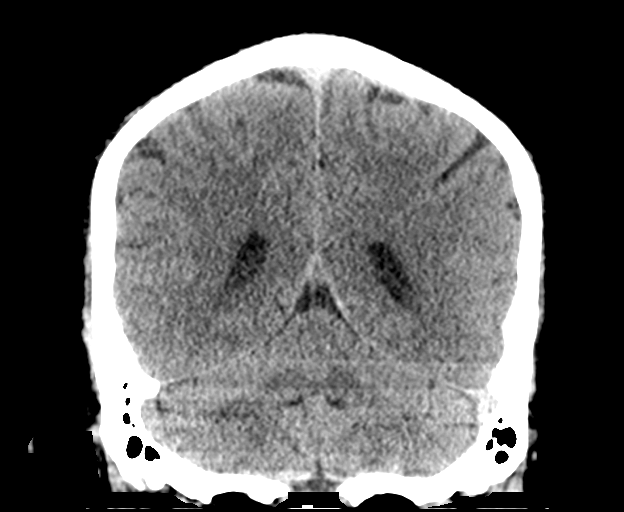
[im 29/66  brain]
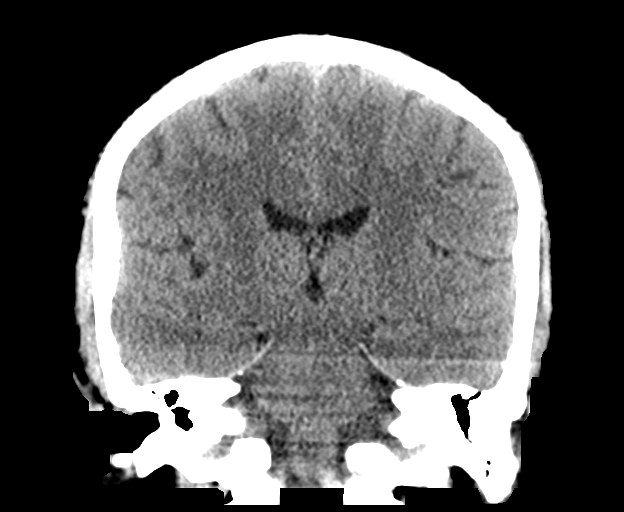
[im 37/66  brain]
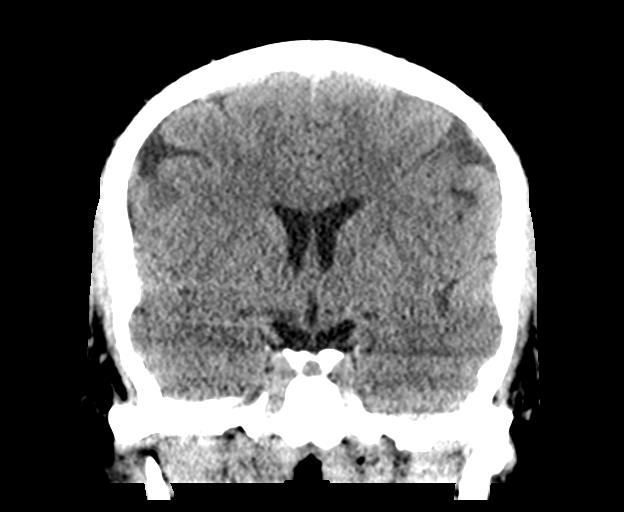

[Series 5: sagittal soft tissue · sagittal · 0.32mm/px · 3 of 52 slices shown]
[im 18/52  brain]
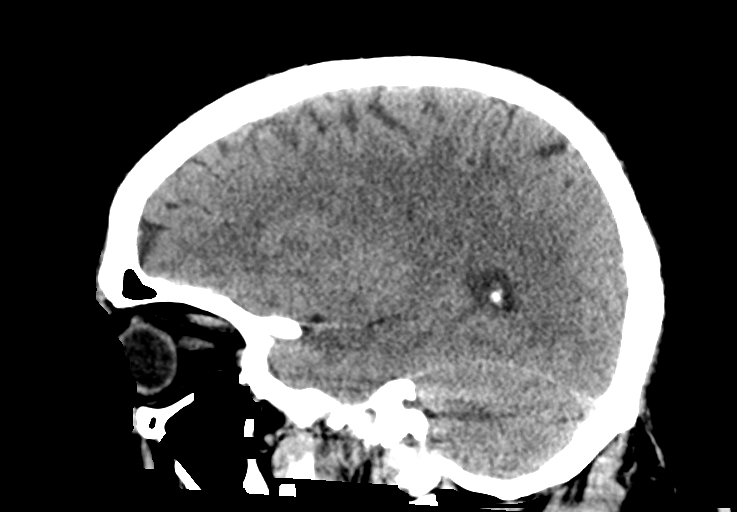
[im 26/52  brain]
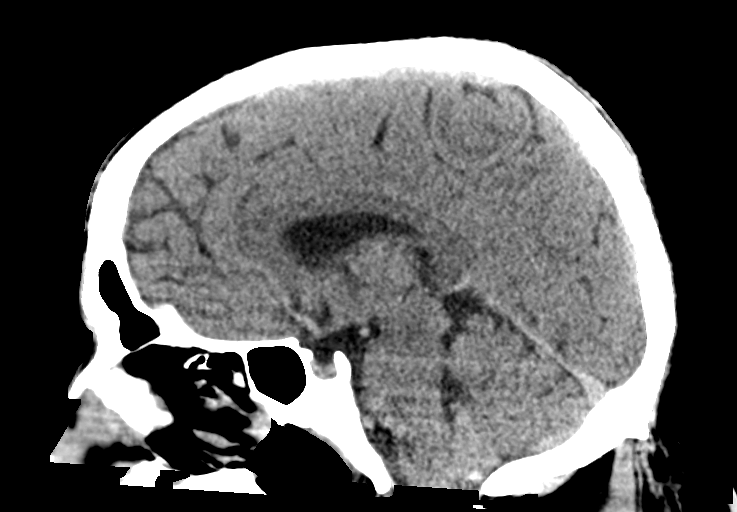
[im 35/52  brain]
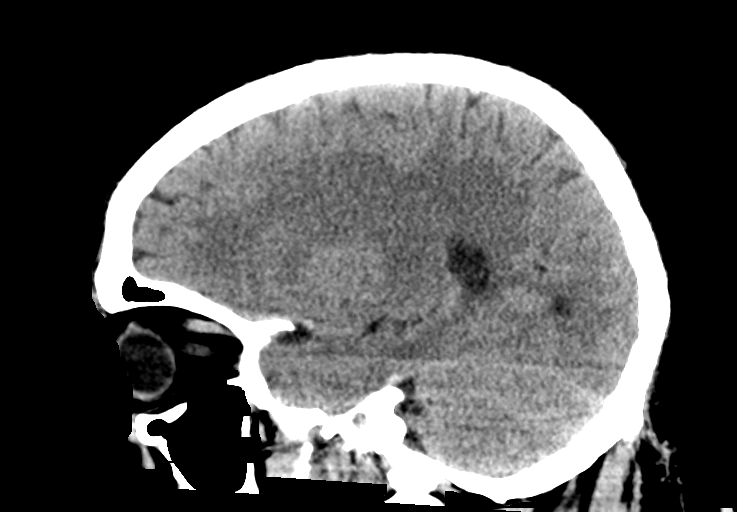

[15 of 47 positions shown; findings below may reference images not displayed]

FINDINGS: Brain: No mass lesion, hemorrhage, hydrocephalus, acute infarct,
intra-axial, or extra-axial fluid collection.

Vascular: No hyperdense vessel or unexpected calcification.

Skull: No significant soft tissue swelling.  No skull fracture.

Sinuses/Orbits: Normal orbits and globes. Incompletely imaged left
maxillary sinus mucous retention cyst or polyp. Clear mastoid air
cells.

Other: None.
IMPRESSION: No acute intracranial abnormality.

## 2018-03-03 IMAGING — CR DG CHEST 2V
2 series · 2 of 2 positions shown · non-contrast
Comparison: 11/15/2015

CLINICAL DATA: Syncope.

EXAM:
CHEST  2 VIEW

[chest pa]
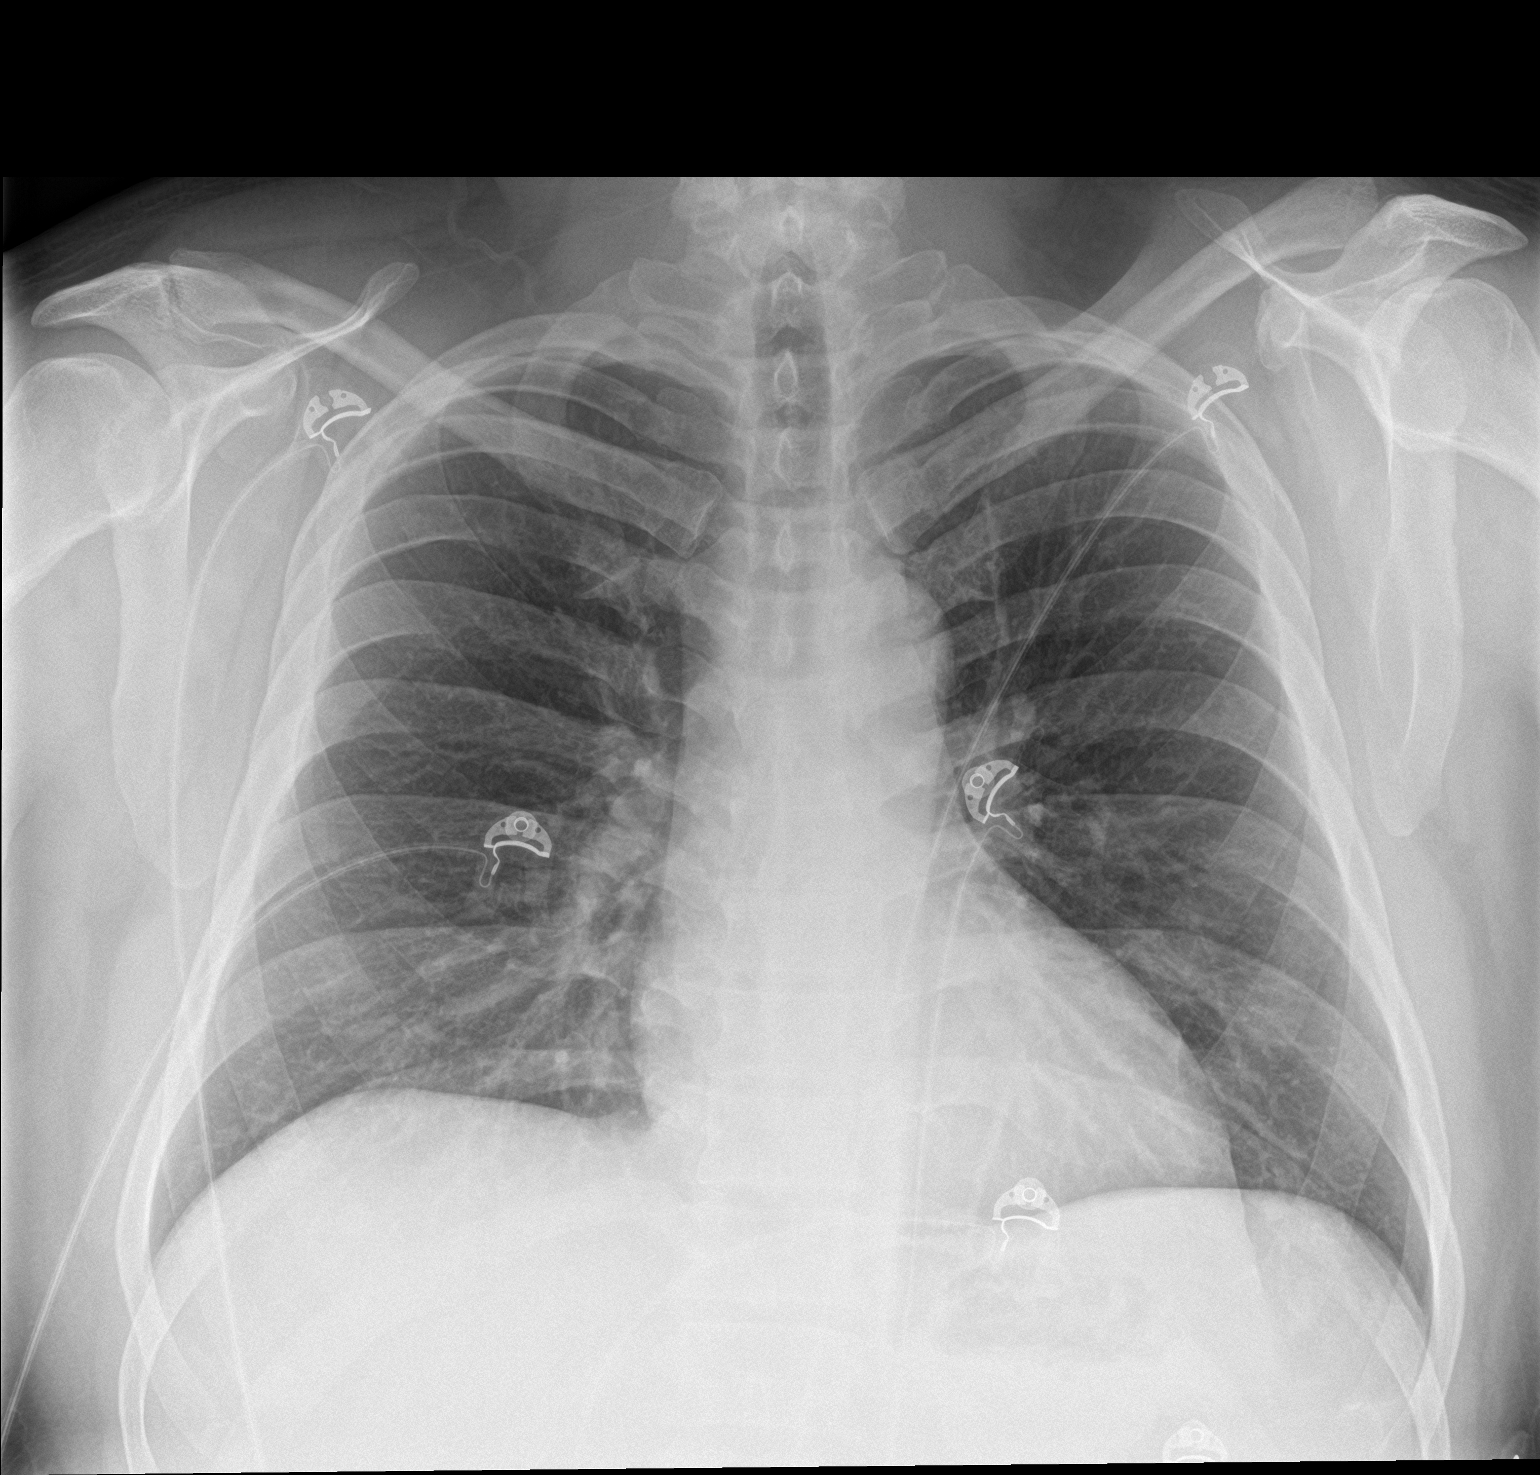

[chest lat]
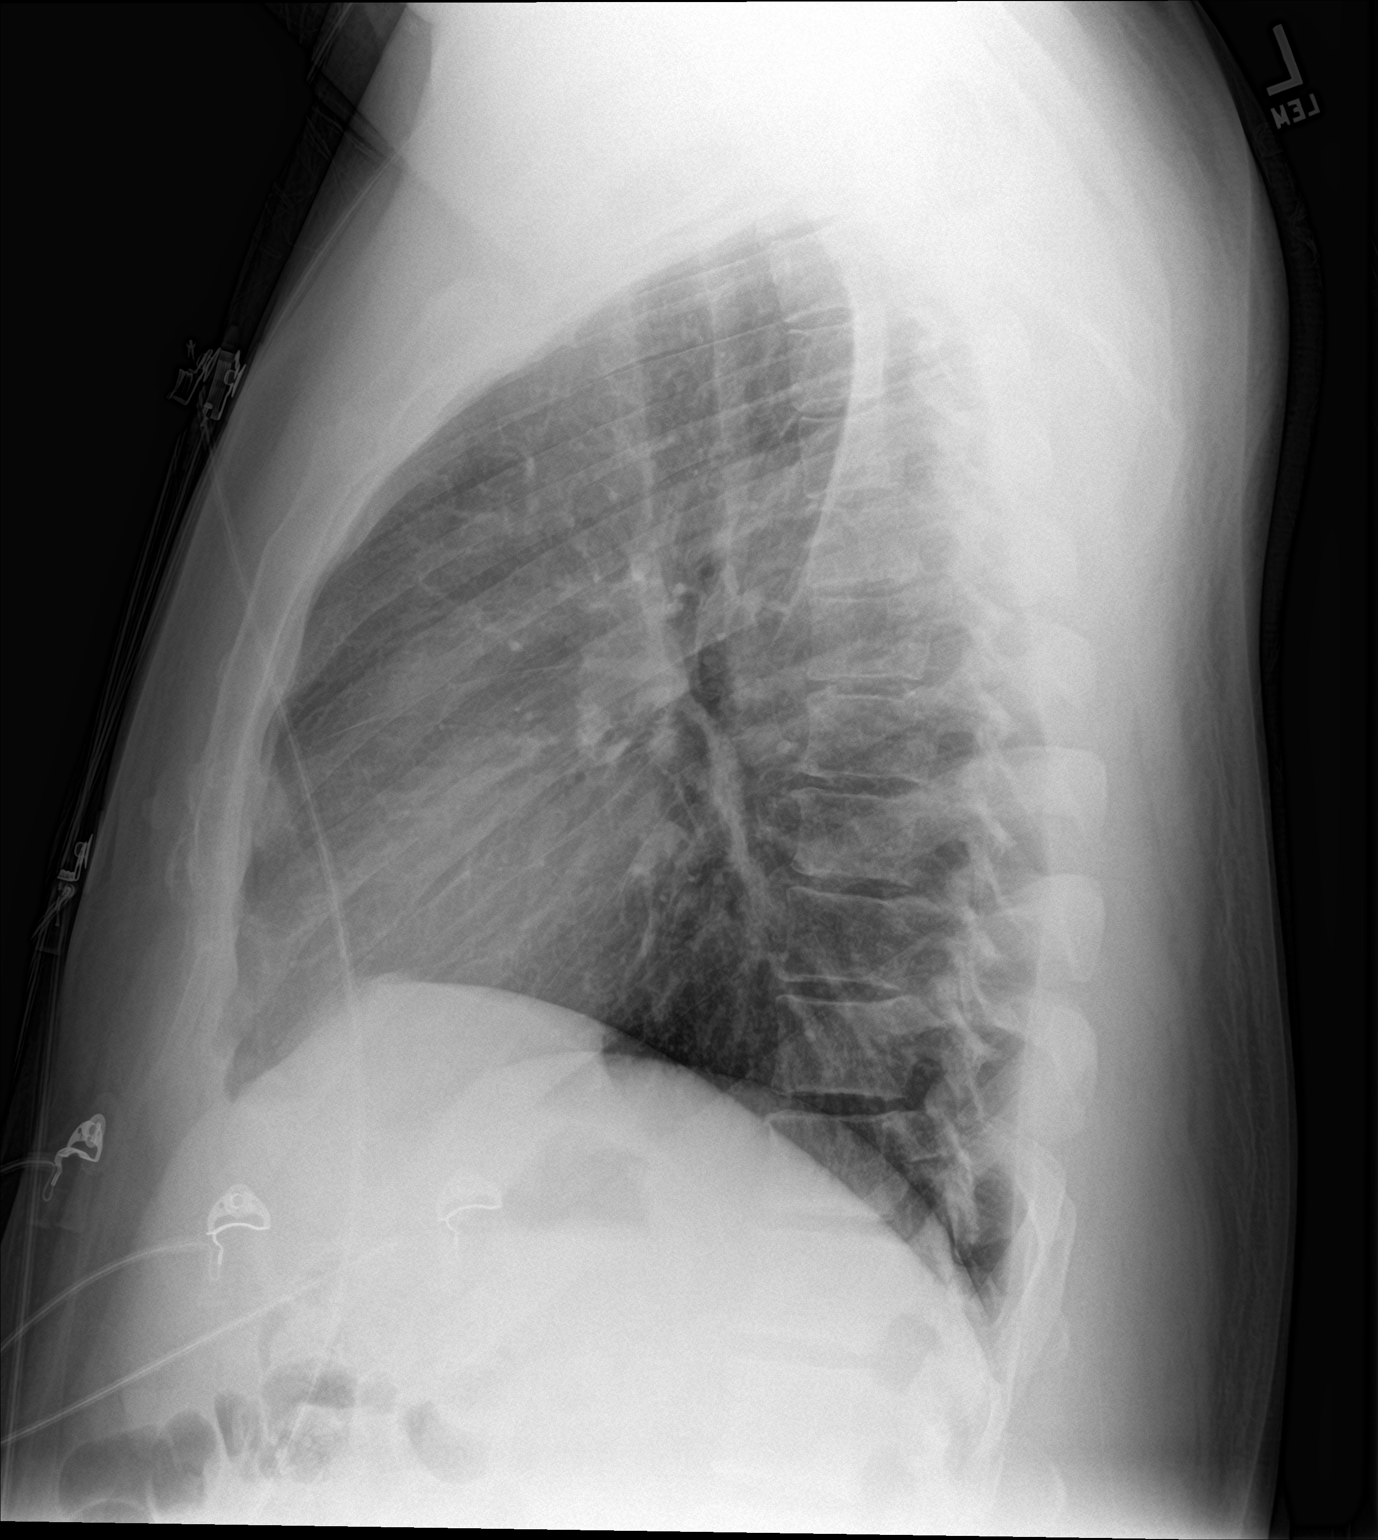

[2 of 2 positions shown; findings below may reference images not displayed]

FINDINGS: The cardiomediastinal contours are normal. The lungs are clear.
Pulmonary vasculature is normal. No consolidation, pleural effusion,
or pneumothorax. No acute osseous abnormalities are seen.
IMPRESSION: No acute abnormality.

## 2018-03-06 ENCOUNTER — Encounter: Payer: Self-pay | Admitting: *Deleted

## 2018-03-06 ENCOUNTER — Emergency Department: Payer: Self-pay

## 2018-03-06 ENCOUNTER — Emergency Department
Admission: EM | Admit: 2018-03-06 | Discharge: 2018-03-06 | Disposition: A | Discharge status: Home / Self Care | Payer: Self-pay | Attending: Emergency Medicine | Admitting: Emergency Medicine

## 2018-03-06 ENCOUNTER — Other Ambulatory Visit: Payer: Self-pay

## 2018-03-06 DIAGNOSIS — X509XXA Other and unspecified overexertion or strenuous movements or postures, initial encounter: Secondary | ICD-10-CM | POA: Insufficient documentation

## 2018-03-06 DIAGNOSIS — Z7982 Long term (current) use of aspirin: Secondary | ICD-10-CM | POA: Insufficient documentation

## 2018-03-06 DIAGNOSIS — Z7901 Long term (current) use of anticoagulants: Secondary | ICD-10-CM | POA: Insufficient documentation

## 2018-03-06 DIAGNOSIS — S96912A Strain of unspecified muscle and tendon at ankle and foot level, left foot, initial encounter: Secondary | ICD-10-CM | POA: Insufficient documentation

## 2018-03-06 DIAGNOSIS — Z87891 Personal history of nicotine dependence: Secondary | ICD-10-CM | POA: Insufficient documentation

## 2018-03-06 DIAGNOSIS — Y92321 Football field as the place of occurrence of the external cause: Secondary | ICD-10-CM | POA: Insufficient documentation

## 2018-03-06 DIAGNOSIS — S93692A Other sprain of left foot, initial encounter: Secondary | ICD-10-CM

## 2018-03-06 DIAGNOSIS — E119 Type 2 diabetes mellitus without complications: Secondary | ICD-10-CM | POA: Insufficient documentation

## 2018-03-06 DIAGNOSIS — Y990 Civilian activity done for income or pay: Secondary | ICD-10-CM | POA: Insufficient documentation

## 2018-03-06 DIAGNOSIS — Y9361 Activity, american tackle football: Secondary | ICD-10-CM | POA: Insufficient documentation

## 2018-03-06 DIAGNOSIS — Z79899 Other long term (current) drug therapy: Secondary | ICD-10-CM | POA: Insufficient documentation

## 2018-03-06 DIAGNOSIS — I10 Essential (primary) hypertension: Secondary | ICD-10-CM | POA: Insufficient documentation

## 2018-03-06 DIAGNOSIS — I2510 Atherosclerotic heart disease of native coronary artery without angina pectoris: Secondary | ICD-10-CM | POA: Insufficient documentation

## 2018-03-06 DIAGNOSIS — Z794 Long term (current) use of insulin: Secondary | ICD-10-CM | POA: Insufficient documentation

## 2018-03-06 MED ORDER — HYDROCODONE-ACETAMINOPHEN 5-325 MG PO TABS: 1.0000 | ORAL_TABLET | Freq: Four times a day (QID) | ORAL | 0 refills | Status: AC | PRN

## 2018-03-06 MED ORDER — ONDANSETRON 4 MG PO TBDP: 4.0000 mg | ORAL_TABLET | Freq: Three times a day (TID) | ORAL | 0 refills | Status: AC | PRN

## 2018-03-06 MED ORDER — MELOXICAM 15 MG PO TABS: 15.0000 mg | ORAL_TABLET | Freq: Every day | ORAL | 1 refills | Status: AC

## 2018-03-06 MED ORDER — MELOXICAM 7.5 MG PO TABS
15.0000 mg | ORAL_TABLET | Freq: Every day | ORAL | Status: DC
  Administered 2018-03-06: 15 mg via ORAL
  Filled 2018-03-06: qty 2

## 2018-03-06 NOTE — ED Provider Notes (Signed)
[facility] Emergency Department Provider Note  ____________________________________________  Time seen: Approximately 10:58 PM  I have reviewed the triage vital signs and the nursing notes.   HISTORY  Chief Complaint Foot Injury    HPI Scott [name] is a 47 y.o. male presents to the emergency department with acute, aching 8 out of 10 left foot pain localized to the plantar aspect of the left foot in the distribution of the plantar fascia.  Patient reports that he was demonstrating a move during football practice when he felt a pop.  Patient has no pain in the calf or the posterior ankle.  Patient has noticed no ecchymosis but has swelling at the medial aspect of the arch.  Patient has attempted no alleviating measures prior to presenting to the emergency department.  He reports that he has some numbness and tingling in the feet but that this is his baseline.   Past Medical History:  Diagnosis Date  . Coronary artery disease involving coronary bypass graft of native heart without angina pectoris   . Diabetes mellitus without complication (HCC)   . Elevated liver enzymes   . GERD (gastroesophageal reflux disease)   . Hyperlipidemia   . Hypertension     Patient Active Problem List   Diagnosis Date Noted  . Angina pectoris (HCC) 08/24/2016  . Left flank pain 01/30/2015  . Hematuria 01/30/2015  . Strain of lumbar paraspinal muscle 01/30/2015  . CAD in native artery 12/05/2014  . Gastro-esophageal reflux disease without esophagitis 12/05/2014  . Adiposity 12/05/2014  . Peripheral autonomic neuropathy 12/05/2014  . Diabetes (HCC) 12/05/2014  . Abnormal liver enzymes 07/31/2007  . HLD (hyperlipidemia) 07/31/2007  . BP (high blood pressure) 07/31/2007  . Diabetes mellitus type 2, uncontrolled (HCC) 07/31/2007    Past Surgical History:  Procedure Laterality Date  . APPENDECTOMY    . CARDIAC CATHETERIZATION  07/31/2007    Prior to Admission medications    Medication Sig Start Date End Date Taking? Authorizing Provider  aspirin EC 81 MG tablet Take 81 mg by mouth daily.    [provider]  clopidogrel (PLAVIX) 75 MG tablet Take 75 mg by mouth daily.    [provider]  gabapentin (NEURONTIN) 300 MG capsule Take 300 mg by mouth 3 (three) times daily.    [provider]  HYDROcodone-acetaminophen (NORCO) 5-325 MG tablet Take 1 tablet by mouth every 6 (six) hours as needed for up to 2 days for moderate pain. 03/06/18 03/08/18  Orvil Feil, PA-C  insulin detemir (LEVEMIR) 100 UNIT/ML injection Inject 0.15 mLs (15 Units total) into the skin at bedtime. 08/26/16   Houston Siren, MD  Insulin Pen Needle (NOVOFINE PLUS) 32G X 4 MM MISC 1 Device by Does not apply route daily. 12/05/14   Edwena Felty, MD  lisinopril (PRINIVIL,ZESTRIL) 20 MG tablet Take 20 mg by mouth daily.    [provider]  meloxicam (MOBIC) 15 MG tablet Take 1 tablet (15 mg total) by mouth daily for 7 days. 03/06/18 03/13/18  Orvil Feil, PA-C  metoprolol succinate (TOPROL-XL) 25 MG 24 hr tablet Take 25 mg by mouth daily.    [provider]  ondansetron (ZOFRAN ODT) 4 MG disintegrating tablet Take 1 tablet (4 mg total) by mouth every 8 (eight) hours as needed for up to 2 days for nausea or vomiting. 03/06/18 03/08/18  Orvil Feil, PA-C  pravastatin (PRAVACHOL) 10 MG tablet Take 1 tablet (10 mg total) by mouth daily. 08/26/16  Houston Siren, MD  ranitidine (ZANTAC) 150 MG capsule Take 150 mg by mouth 2 (two) times daily.    [provider]    Allergies Biaxin  [clarithromycin]; Penicillins; and Prednisone  Family History  Problem Relation Age of Onset  . Cancer Mother        brain cancer  . Coronary artery disease Father   . Heart attack Father     Social History Social History   Tobacco Use  . Smoking status: Former Smoker    Types: Cigarettes, E-cigarettes  . Smokeless tobacco: Never Used  Substance Use Topics   . Alcohol use: No    Alcohol/week: 0.0 standard drinks  . Drug use: No     Review of Systems  Constitutional: No fever/chills Eyes: No visual changes. No discharge ENT: No upper respiratory complaints. Cardiovascular: no chest pain. Respiratory: no cough. No SOB. Gastrointestinal: No abdominal pain.  No nausea, no vomiting.  No diarrhea.  No constipation. Musculoskeletal: Patient has left foot pain.  Skin: Negative for rash, abrasions, lacerations, ecchymosis. Neurological: Negative for headaches, focal weakness or numbness.   ____________________________________________   PHYSICAL EXAM:  VITAL SIGNS: ED Triage Vitals  Enc Vitals Group     BP 03/06/18 1949 (!) 142/97     Pulse Rate 03/06/18 1949 92     Resp 03/06/18 1949 18     Temp 03/06/18 1949 98 F (36.7 C)     Temp Source 03/06/18 1949 Oral     SpO2 03/06/18 1949 98 %     Weight 03/06/18 1950 220 lb (99.8 kg)     Height 03/06/18 1950 5\' 10"  (1.778 m)     Head Circumference --      Peak Flow --      Pain Score 03/06/18 1958 8     Pain Loc --      Pain Edu? --      Excl. in GC? --      Constitutional: Alert and oriented. Well appearing and in no acute distress. Eyes: Conjunctivae are normal. PERRL. EOMI. Head: Atraumatic. Cardiovascular: Normal rate, regular rhythm. Normal S1 and S2.  Good peripheral circulation. Respiratory: Normal respiratory effort without tachypnea or retractions. Lungs CTAB. Good air entry to the bases with no decreased or absent breath sounds. Musculoskeletal: Full range of motion to all extremities.  Patient has edema at the medial aspect of the left arch.  Pain is elicited with palpation from the plantar aspect of the calcaneus to the metacarpal phalangeal joints.  No pain is elicited Neurologic:  Normal speech and language. No gross focal neurologic deficits are appreciated.  Skin:  Skin is warm, dry and intact. No rash noted. Psychiatric: Mood and affect are normal. Speech and  behavior are normal. Patient exhibits appropriate insight and judgement.   ____________________________________________   LABS (all labs ordered are listed, but only abnormal results are displayed)  Labs Reviewed - No data to display ____________________________________________  EKG   ____________________________________________  RADIOLOGY I personally viewed and evaluated these images as part of my medical decision making, as well as reviewing the written report by the radiologist  Dg Ankle Complete Left  Result Date: 03/06/2018 CLINICAL DATA:  Left foot and ankle pain. Pain onset after doing a side step during football practice and feeling a pop. EXAM: LEFT ANKLE COMPLETE - 3+ VIEW COMPARISON:  None. FINDINGS: Small rounded density adjacent to the dorsal talar ridge, margins appear sclerotic. No other evidence of fracture. The alignment and ankle mortise are  preserved. Plantar calcaneal spur and Achilles tendon enthesophyte. No definite tibial talar joint effusion. IMPRESSION: Small rounded density adjacent to the dorsal talar ridge, favored to be chronic. Acute avulsion injury not entirely excluded. Recommend correlation with point tenderness. Electronically Signed   By: Narda Rutherford M.D.   On: 03/06/2018 21:27   Dg Foot Complete Left  Result Date: 03/06/2018 CLINICAL DATA:  Left foot and ankle pain. Pain onset after doing a side step during football practice and feeling a pop. EXAM: LEFT FOOT - COMPLETE 3+ VIEW COMPARISON:  None. FINDINGS: Small rounded density adjacent to the dorsal talar ridge. This is likely chronic with sclerotic margins however acute avulsion injury not entirely excluded. No other evidence of fracture. Well corticated density adjacent to the first tarsal metatarsal joint may be sequela of remote prior injury or an accessory ossicle. Plantar calcaneal spur and Achilles tendon enthesophyte. The alignment and joint spaces are maintained. IMPRESSION: Small rounded  density adjacent to the dorsal talar ridge, likely chronic with wall defined margins. Acute avulsion injury not entirely excluded. Recommend correlation with point tenderness. No other fracture of the foot. Electronically Signed   By: Narda Rutherford M.D.   On: 03/06/2018 21:26    ____________________________________________    PROCEDURES  Procedure(s) performed:    Procedures    Medications  meloxicam (MOBIC) tablet 15 mg (15 mg Oral Given 03/06/18 2204)     ____________________________________________   INITIAL IMPRESSION / ASSESSMENT AND PLAN / ED COURSE  Pertinent labs & imaging results that were available during my care of the patient were reviewed by me and considered in my medical decision making (see chart for details).  Review of the [place] CSRS was performed in accordance of the [facility] prior to dispensing any controlled drugs.      Assessment and plan Plantar fascia rupture Patient presents to the emergency department with left foot pain after patient was demonstrating a football move.  Patient has edema along the medial arch and pain elicited to palpation along the course of the plantar fascia.  I am concerned for plantar fascial rupture.  Patient was given meloxicam in the emergency department.  He was discharged with Norco, meloxicam and Zofran.  He was advised to follow-up with podiatry.  All patient questions were answered.    ____________________________________________  FINAL CLINICAL IMPRESSION(S) / ED DIAGNOSES  Final diagnoses:  Rupture of plantar fascia of left foot, initial encounter      NEW MEDICATIONS STARTED DURING THIS VISIT:  ED Discharge Orders         Ordered    HYDROcodone-acetaminophen (NORCO) 5-325 MG tablet  Every 6 hours PRN     03/06/18 2157    meloxicam (MOBIC) 15 MG tablet  Daily     03/06/18 2157    ondansetron (ZOFRAN ODT) 4 MG disintegrating tablet  Every 8 hours PRN     03/06/18 2157              This chart was  dictated using voice recognition software/Dragon. Despite best efforts to proofread, errors can occur which can change the meaning. Any change was purely unintentional.    Orvil Feil, PA-C 03/06/18 2306    Nita Sickle, MD 03/07/18 [phone]

## 2018-03-06 NOTE — ED Triage Notes (Signed)
Pt has pain in left foot.  Pt stepped off and felt pain in bottom of left foot and left heel.  Pt did not fall    No swelling noted.  Pt alert.

## 2018-07-02 ENCOUNTER — Emergency Department
Admission: EM | Admit: 2018-07-02 | Discharge: 2018-07-02 | Disposition: A | Discharge status: Home / Self Care | Payer: Self-pay | Attending: Emergency Medicine | Admitting: Emergency Medicine

## 2018-07-02 ENCOUNTER — Other Ambulatory Visit: Payer: Self-pay

## 2018-07-02 DIAGNOSIS — E119 Type 2 diabetes mellitus without complications: Secondary | ICD-10-CM | POA: Insufficient documentation

## 2018-07-02 DIAGNOSIS — I10 Essential (primary) hypertension: Secondary | ICD-10-CM | POA: Insufficient documentation

## 2018-07-02 DIAGNOSIS — Y93G1 Activity, food preparation and clean up: Secondary | ICD-10-CM | POA: Insufficient documentation

## 2018-07-02 DIAGNOSIS — Z87891 Personal history of nicotine dependence: Secondary | ICD-10-CM | POA: Insufficient documentation

## 2018-07-02 DIAGNOSIS — W290XXA Contact with powered kitchen appliance, initial encounter: Secondary | ICD-10-CM | POA: Insufficient documentation

## 2018-07-02 DIAGNOSIS — Y929 Unspecified place or not applicable: Secondary | ICD-10-CM | POA: Insufficient documentation

## 2018-07-02 DIAGNOSIS — Z794 Long term (current) use of insulin: Secondary | ICD-10-CM | POA: Insufficient documentation

## 2018-07-02 DIAGNOSIS — Z79899 Other long term (current) drug therapy: Secondary | ICD-10-CM | POA: Insufficient documentation

## 2018-07-02 DIAGNOSIS — Z7902 Long term (current) use of antithrombotics/antiplatelets: Secondary | ICD-10-CM | POA: Insufficient documentation

## 2018-07-02 DIAGNOSIS — Y998 Other external cause status: Secondary | ICD-10-CM | POA: Insufficient documentation

## 2018-07-02 DIAGNOSIS — S61223A Laceration with foreign body of left middle finger without damage to nail, initial encounter: Secondary | ICD-10-CM | POA: Insufficient documentation

## 2018-07-02 DIAGNOSIS — I2510 Atherosclerotic heart disease of native coronary artery without angina pectoris: Secondary | ICD-10-CM | POA: Insufficient documentation

## 2018-07-02 DIAGNOSIS — T148XXA Other injury of unspecified body region, initial encounter: Secondary | ICD-10-CM

## 2018-07-02 MED ORDER — SULFAMETHOXAZOLE-TRIMETHOPRIM 800-160 MG PO TABS: 1.0000 | ORAL_TABLET | Freq: Two times a day (BID) | ORAL | 0 refills | Status: AC

## 2018-07-02 NOTE — ED Provider Notes (Signed)
[facility] Emergency Department Provider Note  ____________________________________________  Time seen: Approximately 10:53 AM  I have reviewed the triage vital signs and the nursing notes.   HISTORY  Chief Complaint Laceration    HPI Scott MussMatthew [name] is a 48 y.o. male with a history of diabetes, presents to the emergency department with an avulsion type laceration along the distal aspect of the left third digit sustained with a meat slicer 3 days ago.  Patient reports that he does not have insurance and did not seek care immediately as he thought laceration would heal without intervention needed.  Patient has been applying a clean dressing daily.  He has noticed no weakness of the digit and has not been experiencing left upper extremity avoidance.  Laceration site has been weeping serosanguineous exudate and has had a smell "like infection" and patient became concerned.  Patient has not noticed redness or streaking surrounding the wound site.  His tetanus status is up-to-date.  No other alleviating measures have been attempted.   Past Medical History:  Diagnosis Date  . Coronary artery disease involving coronary bypass graft of native heart without angina pectoris   . Diabetes mellitus without complication (HCC)   . Elevated liver enzymes   . GERD (gastroesophageal reflux disease)   . Hyperlipidemia   . Hypertension     Patient Active Problem List   Diagnosis Date Noted  . Angina pectoris (HCC) 08/24/2016  . Left flank pain 01/30/2015  . Hematuria 01/30/2015  . Strain of lumbar paraspinal muscle 01/30/2015  . CAD in native artery 12/05/2014  . Gastro-esophageal reflux disease without esophagitis 12/05/2014  . Adiposity 12/05/2014  . Peripheral autonomic neuropathy 12/05/2014  . Diabetes (HCC) 12/05/2014  . Abnormal liver enzymes 07/31/2007  . HLD (hyperlipidemia) 07/31/2007  . BP (high blood pressure) 07/31/2007  . Diabetes mellitus type 2,  uncontrolled (HCC) 07/31/2007    Past Surgical History:  Procedure Laterality Date  . APPENDECTOMY    . CARDIAC CATHETERIZATION  07/31/2007    Prior to Admission medications   Medication Sig Start Date End Date Taking? Authorizing Provider  aspirin EC 81 MG tablet Take 81 mg by mouth daily.    [provider]  clopidogrel (PLAVIX) 75 MG tablet Take 75 mg by mouth daily.    [provider]  gabapentin (NEURONTIN) 300 MG capsule Take 300 mg by mouth 3 (three) times daily.    [provider]  insulin detemir (LEVEMIR) 100 UNIT/ML injection Inject 0.15 mLs (15 Units total) into the skin at bedtime. 08/26/16   Houston SirenSainani, Vivek J, MD  Insulin Pen Needle (NOVOFINE PLUS) 32G X 4 MM MISC 1 Device by Does not apply route daily. 12/05/14   Edwena FeltySundaram, Ashany, MD  lisinopril (PRINIVIL,ZESTRIL) 20 MG tablet Take 20 mg by mouth daily.    [provider]  metoprolol succinate (TOPROL-XL) 25 MG 24 hr tablet Take 25 mg by mouth daily.    [provider]  pravastatin (PRAVACHOL) 10 MG tablet Take 1 tablet (10 mg total) by mouth daily. 08/26/16   Houston SirenSainani, Vivek J, MD  ranitidine (ZANTAC) 150 MG capsule Take 150 mg by mouth 2 (two) times daily.    [provider]  sulfamethoxazole-trimethoprim (BACTRIM DS,SEPTRA DS) 800-160 MG tablet Take 1 tablet by mouth 2 (two) times daily for 7 days. 07/02/18 07/09/18  Orvil FeilWoods, Chou Busler M, PA-C    Allergies Biaxin  [clarithromycin]; Penicillins; and Prednisone  Family History  Problem Relation Age of Onset  . Cancer Mother  brain cancer  . Coronary artery disease Father   . Heart attack Father     Social History Social History   Tobacco Use  . Smoking status: Former Smoker    Types: Cigarettes, E-cigarettes  . Smokeless tobacco: Never Used  Substance Use Topics  . Alcohol use: No    Alcohol/week: 0.0 standard drinks  . Drug use: No     Review of Systems  Constitutional: No fever/chills Eyes: No visual  changes. No discharge ENT: No upper respiratory complaints. Cardiovascular: no chest pain. Respiratory: no cough. No SOB. Gastrointestinal: No abdominal pain.  No nausea, no vomiting.  No diarrhea.  No constipation. Genitourinary: Negative for dysuria. No hematuria Musculoskeletal: Negative for musculoskeletal pain. Skin: Patient has avulsion type laceration of left third digit.  Neurological: Negative for headaches, focal weakness or numbness.  ____________________________________________   PHYSICAL EXAM:  VITAL SIGNS: ED Triage Vitals [07/02/18 1015]  Enc Vitals Group     BP (!) 154/114     Pulse Rate 84     Resp 16     Temp 98.4 F (36.9 C)     Temp Source Oral     SpO2 95 %     Weight 220 lb (99.8 kg)     Height 5\' 9"  (1.753 m)     Head Circumference      Peak Flow      Pain Score 6     Pain Loc      Pain Edu?      Excl. in GC?      Constitutional: Alert and oriented. Well appearing and in no acute distress. Eyes: Conjunctivae are normal. PERRL. EOMI. Head: Atraumatic. Cardiovascular: Normal rate, regular rhythm. Normal S1 and S2.  Good peripheral circulation. Respiratory: Normal respiratory effort without tachypnea or retractions. Lungs CTAB. Good air entry to the bases with no decreased or absent breath sounds. Musculoskeletal: Full range of motion to all extremities. No gross deformities appreciated. Neurologic:  Normal speech and language. No gross focal neurologic deficits are appreciated.  Skin: Patient has avulsion type laceration at the left third digit distal fingertip with involvement of the fingernail.  Very mild erythema along the lateral aspect of the laceration site visualized.  No foul odor was appreciated with dressing change. Psychiatric: Mood and affect are normal. Speech and behavior are normal. Patient exhibits appropriate insight and judgement.   ____________________________________________   LABS (all labs ordered are listed, but only  abnormal results are displayed)  Labs Reviewed - No data to display ____________________________________________  EKG   ____________________________________________  RADIOLOGY   No results found.  ____________________________________________    PROCEDURES  Procedure(s) performed:    Procedures    Medications - No data to display   ____________________________________________   INITIAL IMPRESSION / ASSESSMENT AND PLAN / ED COURSE  Pertinent labs & imaging results that were available during my care of the patient were reviewed by me and considered in my medical decision making (see chart for details).  Review of the [place] CSRS was performed in accordance of the [facility] prior to dispensing any controlled drugs.     Assessment and Plan:  Avulsion type laceration Patient presents to the emergency department with an avulsion type laceration along the distal aspect of the left third fingertip.  There is some very mild erythema along the lateral aspect of the laceration site concerning for early cellulitis.  No purulent exudate was visualized from wound site.  No flexor or extensor tendon deficits were appreciated with testing.  As it has been 48 hours since injury occurred, laceration repair is not indicated.  Patient was discharged with Bactrim.  He was advised to follow-up with primary care as needed.  Patient education regarding wound care was given.  All patient questions were answered.     ____________________________________________  FINAL CLINICAL IMPRESSION(S) / ED DIAGNOSES  Final diagnoses:  Skin avulsion      NEW MEDICATIONS STARTED DURING THIS VISIT:  ED Discharge Orders         Ordered    sulfamethoxazole-trimethoprim (BACTRIM DS,SEPTRA DS) 800-160 MG tablet  2 times daily     07/02/18 1048              This chart was dictated using voice recognition software/Dragon. Despite best efforts to proofread, errors can occur which can change the  meaning. Any change was purely unintentional.    Orvil Feil, PA-C 07/02/18 1058    Jene Every, MD 07/02/18 1202

## 2018-07-02 NOTE — ED Notes (Signed)
Pt reports cutting himself on an onion slicer on Thursday, bleeding continues off and on, pt states the wound is starting to smell, pt states his last tetanus shot was about 10 years ago. Wrapped with gauze at this time.

## 2018-07-02 NOTE — ED Triage Notes (Addendum)
Pt reports that Thursday night he was using a slicer and sliced the top of his left 3 rd finger and nail off, pt states that he has been keeping clean and dry his wife was changing his bandage this am and stopped due to the foul odor coming from the finger, wound appears clean, continues to ooze , cleaned with saline and new bandage applied. Pt reports htn and states no longer on medication due to lack of insurance

## 2018-11-25 ENCOUNTER — Telehealth: Payer: MEDICAID | Admitting: Nurse Practitioner

## 2018-11-25 DIAGNOSIS — M546 Pain in thoracic spine: Secondary | ICD-10-CM

## 2018-11-25 DIAGNOSIS — R1084 Generalized abdominal pain: Secondary | ICD-10-CM

## 2018-11-25 DIAGNOSIS — R509 Fever, unspecified: Secondary | ICD-10-CM

## 2018-11-25 DIAGNOSIS — M542 Cervicalgia: Secondary | ICD-10-CM

## 2018-11-25 NOTE — Progress Notes (Signed)
Based on what you shared with me it looks like you have several things going on,that should be evaluated in a face to face office visit. I would not weight with symptoms you have. You need lab work and possible scan to determine the cause of symptoms.   NOTE: If you entered your credit card information for this eVisit, you will not be charged. You may see a "hold" on your card for the $30 but that hold will drop off and you will not have a charge processed.  If you are having a true medical emergency please call 911.  If you need an urgent face to face visit, [facility] [place] has four urgent care centers for your convenience.  If you need care fast and have a high deductible or no insurance consider:   [email or web address] to reserve your spot online an avoid wait times  [facility] [address], [address] [place], [place] [address] 8 am to 8 pm Monday-Friday 10 am to 4 pm Saturday-Sunday *Across the street from [employer]  [address] [place], [address] 8 am to 5 pm Monday-Friday * In the [facility] on the [facility]   The following sites will take your  insurance:  . [facility]  [phone] Get Driving Directions Find a Provider at this Location  [address] [place], [place] [address] . 10 am to 8 pm Monday-Friday . 12 pm to 8 pm Saturday-Sunday   . [facility] at [facility]  [phone] Get Driving Directions Find a Provider at this Location  [address], [address] [place], [place] [address] . 8 am to 8 pm Monday-Friday . 9 am to 6 pm Saturday . 11 am to 6 pm Sunday   . [facility] at [facility]  [phone] Get Driving Directions  [address] [place], [place] [address] . 8 am to 8 pm Monday-Friday . 8 am to 4 pm Saturday-Sunday   Your e-visit answers were reviewed by a board certified advanced clinical practitioner  to complete your personal care plan.

## 2019-06-20 ENCOUNTER — Encounter: Payer: Self-pay | Admitting: Emergency Medicine

## 2019-06-20 ENCOUNTER — Emergency Department
Admission: EM | Admit: 2019-06-20 | Discharge: 2019-06-21 | Disposition: A | Discharge status: Home / Self Care | Payer: Self-pay | Attending: Emergency Medicine | Admitting: Emergency Medicine

## 2019-06-20 ENCOUNTER — Emergency Department: Payer: Self-pay

## 2019-06-20 ENCOUNTER — Other Ambulatory Visit: Payer: Self-pay

## 2019-06-20 DIAGNOSIS — E1165 Type 2 diabetes mellitus with hyperglycemia: Secondary | ICD-10-CM

## 2019-06-20 DIAGNOSIS — Z79899 Other long term (current) drug therapy: Secondary | ICD-10-CM | POA: Insufficient documentation

## 2019-06-20 DIAGNOSIS — R519 Headache, unspecified: Secondary | ICD-10-CM | POA: Insufficient documentation

## 2019-06-20 DIAGNOSIS — H538 Other visual disturbances: Secondary | ICD-10-CM | POA: Insufficient documentation

## 2019-06-20 DIAGNOSIS — E119 Type 2 diabetes mellitus without complications: Secondary | ICD-10-CM | POA: Insufficient documentation

## 2019-06-20 DIAGNOSIS — I2510 Atherosclerotic heart disease of native coronary artery without angina pectoris: Secondary | ICD-10-CM | POA: Insufficient documentation

## 2019-06-20 DIAGNOSIS — I10 Essential (primary) hypertension: Secondary | ICD-10-CM | POA: Insufficient documentation

## 2019-06-20 DIAGNOSIS — R079 Chest pain, unspecified: Secondary | ICD-10-CM | POA: Insufficient documentation

## 2019-06-20 DIAGNOSIS — Z7982 Long term (current) use of aspirin: Secondary | ICD-10-CM | POA: Insufficient documentation

## 2019-06-20 DIAGNOSIS — Z794 Long term (current) use of insulin: Secondary | ICD-10-CM | POA: Insufficient documentation

## 2019-06-20 DIAGNOSIS — IMO0002 Reserved for concepts with insufficient information to code with codable children: Secondary | ICD-10-CM

## 2019-06-20 DIAGNOSIS — R42 Dizziness and giddiness: Secondary | ICD-10-CM | POA: Insufficient documentation

## 2019-06-20 DIAGNOSIS — Z87891 Personal history of nicotine dependence: Secondary | ICD-10-CM | POA: Insufficient documentation

## 2019-06-20 LAB — CBC
HCT: 47 % (ref 39.0–52.0)
Hemoglobin: 17.3 g/dL — ABNORMAL HIGH (ref 13.0–17.0)
MCH: 28.8 pg (ref 26.0–34.0)
MCHC: 36.8 g/dL — ABNORMAL HIGH (ref 30.0–36.0)
MCV: 78.2 fL — ABNORMAL LOW (ref 80.0–100.0)
Platelets: 180 10*3/uL (ref 150–400)
RBC: 6.01 MIL/uL — ABNORMAL HIGH (ref 4.22–5.81)
RDW: 12.4 % (ref 11.5–15.5)
WBC: 7.6 10*3/uL (ref 4.0–10.5)
nRBC: 0 % (ref 0.0–0.2)

## 2019-06-20 LAB — BASIC METABOLIC PANEL
Anion gap: 12 (ref 5–15)
BUN: 13 mg/dL (ref 6–20)
CO2: 25 mmol/L (ref 22–32)
Calcium: 9.5 mg/dL (ref 8.9–10.3)
Chloride: 98 mmol/L (ref 98–111)
Creatinine, Ser: 0.9 mg/dL (ref 0.61–1.24)
GFR calc Af Amer: >60 mL/min (ref >60)
GFR calc non Af Amer: >60 mL/min (ref >60)
Glucose, Bld: 268 mg/dL — ABNORMAL HIGH (ref 70–99)
Potassium: 3.9 mmol/L (ref 3.5–5.1)
Sodium: 135 mmol/L (ref 135–145)

## 2019-06-20 LAB — TROPONIN I (HIGH SENSITIVITY)
Troponin I (High Sensitivity): 3 ng/L (ref <18)
Troponin I (High Sensitivity): 4 ng/L (ref <18)

## 2019-06-20 MED ORDER — METOPROLOL SUCCINATE ER 25 MG PO TB24: 25.0000 mg | ORAL_TABLET | Freq: Every day | ORAL | 0 refills | Status: DC

## 2019-06-20 MED ORDER — LISINOPRIL 20 MG PO TABS: 20.0000 mg | ORAL_TABLET | Freq: Every day | ORAL | 0 refills | Status: DC

## 2019-06-20 MED ORDER — RANITIDINE HCL 150 MG PO CAPS: 150.0000 mg | ORAL_CAPSULE | Freq: Two times a day (BID) | ORAL | 0 refills | Status: DC

## 2019-06-20 MED ORDER — ASPIRIN EC 81 MG PO TBEC
81.0000 mg | DELAYED_RELEASE_TABLET | Freq: Once | ORAL | Status: AC
  Administered 2019-06-20: 81 mg via ORAL
  Filled 2019-06-20: qty 1

## 2019-06-20 MED ORDER — INSULIN DETEMIR 100 UNIT/ML ~~LOC~~ SOLN: 15.0000 [IU] | Freq: Every day | SUBCUTANEOUS | 0 refills | Status: DC

## 2019-06-20 MED ORDER — CLONIDINE HCL 0.1 MG PO TABS
0.1000 mg | ORAL_TABLET | Freq: Once | ORAL | Status: AC
  Administered 2019-06-20: 0.1 mg via ORAL
  Filled 2019-06-20: qty 1

## 2019-06-20 MED ORDER — CLOPIDOGREL BISULFATE 75 MG PO TABS: 75.0000 mg | ORAL_TABLET | Freq: Every day | ORAL | 0 refills | Status: DC

## 2019-06-20 MED ORDER — PRAVASTATIN SODIUM 10 MG PO TABS: 10.0000 mg | ORAL_TABLET | Freq: Every day | ORAL | 0 refills | Status: DC

## 2019-06-20 MED ORDER — NOVOFINE PLUS 32G X 4 MM MISC: 1.0000 | Freq: Every day | 0 refills | Status: DC

## 2019-06-20 NOTE — ED Provider Notes (Signed)
[facility] Emergency Department Provider Note   ____________________________________________   First MD Initiated Contact with Patient 06/20/19 2315     (approximate)  I have reviewed the triage vital signs and the nursing notes.   HISTORY  Chief Complaint Chest Pain and Hypertension    HPI Scott [name] is a 48 y.o. male who presents to the ED from home with a chief complaint of chest pain and hypertension.  Patient has a history of CAD status post stents, hypertension, diabetes who has been noncompliant with his medications for over 1 year.  Reports central chest tightness starting around 3 PM radiating down his left arm.  Took his blood pressure at home and noted 178/118.  Had some slight dizziness and blurry vision at baseline.  Mild headache this afternoon.  Denies associated diaphoresis, shortness of breath, nausea/vomiting, palpitations.  Denies COVID-19 exposure.       Past Medical History:  Diagnosis Date  . Coronary artery disease involving coronary bypass graft of native heart without angina pectoris   . Diabetes mellitus without complication ([place])   . Elevated liver enzymes   . GERD (gastroesophageal reflux disease)   . Hyperlipidemia   . Hypertension     Patient Active Problem List   Diagnosis Date Noted  . Angina pectoris ([place]) 08/24/2016  . Left flank pain 01/30/2015  . Hematuria 01/30/2015  . Strain of lumbar paraspinal muscle 01/30/2015  . CAD in native artery 12/05/2014  . Gastro-esophageal reflux disease without esophagitis 12/05/2014  . Adiposity 12/05/2014  . Peripheral autonomic neuropathy 12/05/2014  . Diabetes ([place]) 12/05/2014  . Abnormal liver enzymes 07/31/2007  . HLD (hyperlipidemia) 07/31/2007  . BP (high blood pressure) 07/31/2007  . Diabetes mellitus type 2, uncontrolled ([place]) 07/31/2007    Past Surgical History:  Procedure Laterality Date  . APPENDECTOMY    . CARDIAC CATHETERIZATION  07/31/2007    Prior  to Admission medications   Medication Sig Start Date End Date Taking? Authorizing Provider  aspirin EC 81 MG tablet Take 81 mg by mouth daily.    [provider]  clopidogrel (PLAVIX) 75 MG tablet Take 1 tablet (75 mg total) by mouth daily. 06/20/19   Paulette Blanch, MD  gabapentin (NEURONTIN) 300 MG capsule Take 300 mg by mouth 3 (three) times daily.    [provider]  insulin detemir (LEVEMIR) 100 UNIT/ML injection Inject 0.15 mLs (15 Units total) into the skin at bedtime. 06/20/19   Paulette Blanch, MD  Insulin Pen Needle (NOVOFINE PLUS) 32G X 4 MM MISC 1 Device by Does not apply route daily. 06/20/19   Paulette Blanch, MD  lisinopril (ZESTRIL) 20 MG tablet Take 1 tablet (20 mg total) by mouth daily. 06/20/19   Paulette Blanch, MD  metoprolol succinate (TOPROL-XL) 25 MG 24 hr tablet Take 1 tablet (25 mg total) by mouth daily. 06/20/19   Paulette Blanch, MD  pravastatin (PRAVACHOL) 10 MG tablet Take 1 tablet (10 mg total) by mouth daily. 06/20/19   Paulette Blanch, MD  ranitidine (ZANTAC) 150 MG capsule Take 1 capsule (150 mg total) by mouth 2 (two) times daily. 06/20/19   Paulette Blanch, MD    Allergies Biaxin  [clarithromycin], Penicillins, and Prednisone  Family History  Problem Relation Age of Onset  . Cancer Mother        brain cancer  . Coronary artery disease Father   . Heart attack Father     Social History Social History  Tobacco Use  . Smoking status: Former Smoker    Types: Cigarettes, E-cigarettes  . Smokeless tobacco: Never Used  Substance Use Topics  . Alcohol use: No    Alcohol/week: 0.0 standard drinks  . Drug use: No    Review of Systems  Constitutional: No fever/chills Eyes: No visual changes. ENT: No sore throat. Cardiovascular: Positive for chest pain. Respiratory: Denies shortness of breath. Gastrointestinal: No abdominal pain.  No nausea, no vomiting.  No diarrhea.  No constipation. Genitourinary: Negative for dysuria. Musculoskeletal: Negative  for back pain. Skin: Negative for rash. Neurological: Positive for headache.  Negative for focal weakness or numbness.   ____________________________________________   PHYSICAL EXAM:  VITAL SIGNS: ED Triage Vitals [06/20/19 1818]  Enc Vitals Group     BP (!) 180/102     Pulse Rate 82     Resp 16     Temp 98.6 F (37 C)     Temp Source Oral     SpO2 96 %     Weight 220 lb (99.8 kg)     Height 5\' 10"  (1.778 m)     Head Circumference      Peak Flow      Pain Score 4     Pain Loc      Pain Edu?      Excl. in GC?     Constitutional: Alert and oriented. Well appearing and in no acute distress. Eyes: Conjunctivae are normal. PERRL. EOMI. Head: Atraumatic. Nose: No congestion/rhinnorhea. Mouth/Throat: Mucous membranes are moist.  Oropharynx non-erythematous. Neck: No stridor.   Cardiovascular: Normal rate, regular rhythm. Grossly normal heart sounds.  Good peripheral circulation. Respiratory: Normal respiratory effort.  No retractions. Lungs CTAB. Gastrointestinal: Soft and nontender. No distention. No abdominal bruits. No CVA tenderness. Musculoskeletal: No lower extremity tenderness nor edema.  No joint effusions. Neurologic:  Normal speech and language. No gross focal neurologic deficits are appreciated. No gait instability. Skin:  Skin is warm, dry and intact. No rash noted. Psychiatric: Mood and affect are normal. Speech and behavior are normal.  ____________________________________________   LABS (all labs ordered are listed, but only abnormal results are displayed)  Labs Reviewed  BASIC METABOLIC PANEL - Abnormal; Notable for the following components:      Result Value   Glucose, Bld 268 (*)    All other components within normal limits  CBC - Abnormal; Notable for the following components:   RBC 6.01 (*)    Hemoglobin 17.3 (*)    MCV 78.2 (*)    MCHC 36.8 (*)    All other components within normal limits  TROPONIN I (HIGH SENSITIVITY)  TROPONIN I (HIGH  SENSITIVITY)   ____________________________________________  EKG  ED ECG REPORT I, SUNG,JADE J, the attending physician, personally viewed and interpreted this ECG.   Date: 06/20/2019  EKG Time: 1812  Rate: 82  Rhythm: normal EKG, normal sinus rhythm  Axis: Normal  Intervals:none  ST&T Change: Nonspecific  ____________________________________________  RADIOLOGY  ED MD interpretation: No acute cardiopulmonary process  Official radiology report(s): DG Chest 2 View  Result Date: 06/20/2019 CLINICAL DATA:  Chest pain EXAM: CHEST - 2 VIEW COMPARISON:  None. FINDINGS: The heart size and mediastinal contours are within normal limits. Both lungs are clear. The visualized skeletal structures are unremarkable. IMPRESSION: No active cardiopulmonary disease. Electronically Signed   By: Jonna ClarkBindu  Avutu M.D.   On: 06/20/2019 19:04    ____________________________________________   PROCEDURES  Procedure(s) performed (including Critical Care):  Procedures   ____________________________________________  INITIAL IMPRESSION / ASSESSMENT AND PLAN / ED COURSE  As part of my medical decision making, I reviewed the following data within the electronic MEDICAL RECORD NUMBER Nursing notes reviewed and incorporated, Labs reviewed, EKG interpreted, Old chart reviewed, Radiograph reviewed and Notes from prior ED visits     De Jaworski was evaluated in Emergency Department on 06/20/2019 for the symptoms described in the history of present illness. He was evaluated in the context of the global COVID-19 pandemic, which necessitated consideration that the patient might be at risk for infection with the SARS-CoV-2 virus that causes COVID-19. Institutional protocols and algorithms that pertain to the evaluation of patients at risk for COVID-19 are in a state of rapid change based on information released by regulatory bodies including the CDC and federal and state organizations. These policies and  algorithms were followed during the patient's care in the ED.    48 year old male with CAD status post stent, hypertension and diabetes who is noncompliant with medications for over 1 year presenting with chest pain and hypertension. Differential diagnosis includes, but is not limited to, ACS, aortic dissection, pulmonary embolism, cardiac tamponade, pneumothorax, pneumonia, pericarditis, myocarditis, GI-related causes including esophagitis/gastritis, and musculoskeletal chest wall pain.    I personally reviewed patient's records and see that he had a stent placed in 2018 at [facility].  He believes this was the last time he took his medications.  Currently he states his chest tightness has improved and he is eager for discharge home.  I did offer him hospitalization given that he does have a cardiac history with currently uncontrolled hypertension.  Patient declines, even the upcoming Christmas holiday.  He does tell me he will restart his medications of I refill his prescriptions.  I have refilled prescriptions for Plavix, insulin, lisinopril,  metoprolol, pravastatin and ranitidine.  I have strongly encouraged the patient to restart these medications and to follow-up with cardiology.  Strict return precautions given.  Patient verbalizes understanding agrees with plan of care.      ____________________________________________   FINAL CLINICAL IMPRESSION(S) / ED DIAGNOSES  Final diagnoses:  Nonspecific chest pain  Essential hypertension     ED Discharge Orders         Ordered    clopidogrel (PLAVIX) 75 MG tablet  Daily     06/20/19 2335    insulin detemir (LEVEMIR) 100 UNIT/ML injection  Daily at bedtime     06/20/19 2335    Insulin Pen Needle (NOVOFINE PLUS) 32G X 4 MM MISC  Daily     06/20/19 2335    lisinopril (ZESTRIL) 20 MG tablet  Daily     06/20/19 2335    metoprolol succinate (TOPROL-XL) 25 MG 24 hr tablet  Daily     06/20/19 2335    pravastatin (PRAVACHOL) 10 MG tablet  Daily      06/20/19 2335    ranitidine (ZANTAC) 150 MG capsule  2 times daily     06/20/19 2335           Note:  This document was prepared using Dragon voice recognition software and may include unintentional dictation errors.   Irean Hong, MD 06/21/19 [id number]

## 2019-06-20 NOTE — Discharge Instructions (Signed)
Please restart all of your medicines which have been refilled for you.  Not taking your medicines may lead to heart attack or stroke.  Return to the ER for recurrent or worsening symptoms, persistent vomiting, difficulty breathing or other concerns.

## 2019-06-20 NOTE — ED Triage Notes (Signed)
Patient presents to the ED with chest pain that began this afternoon.  Patient describes pain as tightness in the mid-left chest and radiates down his left arm.  Patient also noted hypertension at home of 178/118.  Patient reports slight dizziness and states he has blurry vision at baseline.  Patient reports headache.  Patient reports history of cardiac stents.

## 2019-06-21 ENCOUNTER — Telehealth: Payer: Self-pay | Admitting: Emergency Medicine

## 2019-06-21 DIAGNOSIS — E1165 Type 2 diabetes mellitus with hyperglycemia: Secondary | ICD-10-CM

## 2019-06-21 DIAGNOSIS — IMO0002 Reserved for concepts with insufficient information to code with codable children: Secondary | ICD-10-CM

## 2019-06-21 MED ORDER — ASPIRIN EC 81 MG PO TBEC: 81.0000 mg | DELAYED_RELEASE_TABLET | Freq: Every day | ORAL | 1 refills | Status: AC

## 2019-06-21 MED ORDER — METOPROLOL SUCCINATE ER 25 MG PO TB24: 25.0000 mg | ORAL_TABLET | Freq: Every day | ORAL | 0 refills | Status: DC

## 2019-06-21 MED ORDER — NOVOFINE PLUS 32G X 4 MM MISC: 1.0000 | Freq: Every day | 0 refills | Status: DC

## 2019-06-21 MED ORDER — CLOPIDOGREL BISULFATE 75 MG PO TABS: 75.0000 mg | ORAL_TABLET | Freq: Every day | ORAL | 0 refills | Status: DC

## 2019-06-21 MED ORDER — GABAPENTIN 300 MG PO CAPS: 300.0000 mg | ORAL_CAPSULE | Freq: Three times a day (TID) | ORAL | 1 refills | Status: DC

## 2019-06-21 MED ORDER — RANITIDINE HCL 150 MG PO CAPS: 150.0000 mg | ORAL_CAPSULE | Freq: Two times a day (BID) | ORAL | 0 refills | Status: DC

## 2019-06-21 MED ORDER — LISINOPRIL 20 MG PO TABS: 20.0000 mg | ORAL_TABLET | Freq: Every day | ORAL | 0 refills | Status: DC

## 2019-06-21 MED ORDER — GABAPENTIN 300 MG PO CAPS: 300.0000 mg | ORAL_CAPSULE | Freq: Three times a day (TID) | ORAL | 1 refills | Status: AC

## 2019-06-21 MED ORDER — PRAVASTATIN SODIUM 10 MG PO TABS: 10.0000 mg | ORAL_TABLET | Freq: Every day | ORAL | 0 refills | Status: DC

## 2019-06-21 MED ORDER — INSULIN DETEMIR 100 UNIT/ML ~~LOC~~ SOLN: 15.0000 [IU] | Freq: Every day | SUBCUTANEOUS | 0 refills | Status: DC

## 2019-06-21 MED ORDER — ASPIRIN EC 81 MG PO TBEC: 81.0000 mg | DELAYED_RELEASE_TABLET | Freq: Every day | ORAL | 1 refills | Status: DC

## 2019-06-21 MED ORDER — PRAVASTATIN SODIUM 10 MG PO TABS: 10.0000 mg | ORAL_TABLET | Freq: Every day | ORAL | 0 refills | Status: AC

## 2019-06-21 MED ORDER — INSULIN DETEMIR 100 UNIT/ML ~~LOC~~ SOLN: 15.0000 [IU] | Freq: Every day | SUBCUTANEOUS | 0 refills | Status: AC

## 2019-06-21 NOTE — Telephone Encounter (Signed)
I have attempted again to send his medications or print them

## 2019-06-21 NOTE — ED Notes (Signed)
12/24 1000--meds ordered by dr sung called to [facility] [address].  Did not call ranitidine as it is off market.

## 2019-06-21 NOTE — Telephone Encounter (Signed)
I resent his medications to [facility] on [address] in [place]

## 2020-02-02 ENCOUNTER — Emergency Department
Admission: EM | Admit: 2020-02-02 | Discharge: 2020-02-02 | Disposition: A | Discharge status: Home / Self Care | Payer: Self-pay | Attending: Emergency Medicine | Admitting: Emergency Medicine

## 2020-02-02 ENCOUNTER — Emergency Department: Payer: Self-pay

## 2020-02-02 ENCOUNTER — Other Ambulatory Visit: Payer: Self-pay

## 2020-02-02 DIAGNOSIS — Z23 Encounter for immunization: Secondary | ICD-10-CM | POA: Insufficient documentation

## 2020-02-02 DIAGNOSIS — S62631B Displaced fracture of distal phalanx of left index finger, initial encounter for open fracture: Secondary | ICD-10-CM | POA: Insufficient documentation

## 2020-02-02 DIAGNOSIS — Y929 Unspecified place or not applicable: Secondary | ICD-10-CM | POA: Insufficient documentation

## 2020-02-02 DIAGNOSIS — Y999 Unspecified external cause status: Secondary | ICD-10-CM | POA: Insufficient documentation

## 2020-02-02 DIAGNOSIS — I10 Essential (primary) hypertension: Secondary | ICD-10-CM | POA: Insufficient documentation

## 2020-02-02 DIAGNOSIS — S61211A Laceration without foreign body of left index finger without damage to nail, initial encounter: Secondary | ICD-10-CM | POA: Insufficient documentation

## 2020-02-02 DIAGNOSIS — Z794 Long term (current) use of insulin: Secondary | ICD-10-CM | POA: Insufficient documentation

## 2020-02-02 DIAGNOSIS — E1140 Type 2 diabetes mellitus with diabetic neuropathy, unspecified: Secondary | ICD-10-CM | POA: Insufficient documentation

## 2020-02-02 DIAGNOSIS — Z79899 Other long term (current) drug therapy: Secondary | ICD-10-CM | POA: Insufficient documentation

## 2020-02-02 DIAGNOSIS — I2510 Atherosclerotic heart disease of native coronary artery without angina pectoris: Secondary | ICD-10-CM | POA: Insufficient documentation

## 2020-02-02 DIAGNOSIS — W312XXA Contact with powered woodworking and forming machines, initial encounter: Secondary | ICD-10-CM | POA: Insufficient documentation

## 2020-02-02 DIAGNOSIS — S62639B Displaced fracture of distal phalanx of unspecified finger, initial encounter for open fracture: Secondary | ICD-10-CM

## 2020-02-02 DIAGNOSIS — Z7982 Long term (current) use of aspirin: Secondary | ICD-10-CM | POA: Insufficient documentation

## 2020-02-02 DIAGNOSIS — Z955 Presence of coronary angioplasty implant and graft: Secondary | ICD-10-CM | POA: Insufficient documentation

## 2020-02-02 DIAGNOSIS — Z87891 Personal history of nicotine dependence: Secondary | ICD-10-CM | POA: Insufficient documentation

## 2020-02-02 DIAGNOSIS — Y939 Activity, unspecified: Secondary | ICD-10-CM | POA: Insufficient documentation

## 2020-02-02 MED ORDER — TETANUS-DIPHTH-ACELL PERTUSSIS 5-2.5-18.5 LF-MCG/0.5 IM SUSP
0.5000 mL | Freq: Once | INTRAMUSCULAR | Status: AC
  Administered 2020-02-02: 0.5 mL via INTRAMUSCULAR
  Filled 2020-02-02: qty 0.5

## 2020-02-02 MED ORDER — ONDANSETRON 4 MG PO TBDP
4.0000 mg | ORAL_TABLET | Freq: Once | ORAL | Status: AC
  Administered 2020-02-02: 4 mg via ORAL
  Filled 2020-02-02: qty 1

## 2020-02-02 MED ORDER — FENTANYL CITRATE (PF) 100 MCG/2ML IJ SOLN
50.0000 ug | Freq: Once | INTRAMUSCULAR | Status: AC
  Administered 2020-02-02: 50 ug via INTRAMUSCULAR
  Filled 2020-02-02: qty 2

## 2020-02-02 MED ORDER — LIDOCAINE HCL 1 % IJ SOLN
10.0000 mL | Freq: Once | INTRAMUSCULAR | Status: AC
  Administered 2020-02-02: 10 mL

## 2020-02-02 MED ORDER — SULFAMETHOXAZOLE-TRIMETHOPRIM 800-160 MG PO TABS: 1.0000 | ORAL_TABLET | Freq: Two times a day (BID) | ORAL | 0 refills | Status: AC

## 2020-02-02 MED ORDER — LIDOCAINE HCL (PF) 1 % IJ SOLN
INTRAMUSCULAR | Status: AC
  Filled 2020-02-02: qty 5

## 2020-02-02 NOTE — ED Provider Notes (Signed)
Emergency Department Provider Note  ____________________________________________  Time seen: Approximately 9:34 PM  I have reviewed the triage vital signs and the nursing notes.   HISTORY  Chief Complaint Hand Injury   Historian Patient     HPI Scott [name] is a 49 y.o. male presents to the emergency department with a left index finger laceration sustained accidentally with a chainsaw.  Patient states that he was cutting wood in anticipation of starting a bonfire.  No numbness or tingling in the left digit.  Tetanus status is out of date.   Past Medical History:  Diagnosis Date  . Coronary artery disease involving coronary bypass graft of native heart without angina pectoris   . Diabetes mellitus without complication ([facility])   . Elevated liver enzymes   . GERD (gastroesophageal reflux disease)   . Hyperlipidemia   . Hypertension      Immunizations up to date:  Yes.     Past Medical History:  Diagnosis Date  . Coronary artery disease involving coronary bypass graft of native heart without angina pectoris   . Diabetes mellitus without complication ([facility])   . Elevated liver enzymes   . GERD (gastroesophageal reflux disease)   . Hyperlipidemia   . Hypertension     Patient Active Problem List   Diagnosis Date Noted  . Angina pectoris ([facility]) 08/24/2016  . Left flank pain 01/30/2015  . Hematuria 01/30/2015  . Strain of lumbar paraspinal muscle 01/30/2015  . CAD in native artery 12/05/2014  . Gastro-esophageal reflux disease without esophagitis 12/05/2014  . Adiposity 12/05/2014  . Peripheral autonomic neuropathy 12/05/2014  . Diabetes ([facility]) 12/05/2014  . Abnormal liver enzymes 07/31/2007  . HLD (hyperlipidemia) 07/31/2007  . BP (high blood pressure) 07/31/2007  . Diabetes mellitus type 2, uncontrolled ([facility]) 07/31/2007    Past Surgical History:  Procedure Laterality Date  . APPENDECTOMY    . CARDIAC CATHETERIZATION  07/31/2007    Prior to Admission  medications   Medication Sig Start Date End Date Taking? Authorizing Provider  aspirin EC 81 MG tablet Take 1 tablet (81 mg total) by mouth daily. 06/21/19   Emily Filbert, MD  clopidogrel (PLAVIX) 75 MG tablet Take 1 tablet (75 mg total) by mouth daily. 06/21/19   Emily Filbert, MD  gabapentin (NEURONTIN) 300 MG capsule Take 1 capsule (300 mg total) by mouth 3 (three) times daily. 06/21/19   Emily Filbert, MD  insulin detemir (LEVEMIR) 100 UNIT/ML injection Inject 0.15 mLs (15 Units total) into the skin at bedtime. 06/21/19   Emily Filbert, MD  Insulin Pen Needle (NOVOFINE PLUS) 32G X 4 MM MISC 1 Device by Does not apply route daily. 06/21/19   Emily Filbert, MD  lisinopril (ZESTRIL) 20 MG tablet Take 1 tablet (20 mg total) by mouth daily. 06/21/19   Emily Filbert, MD  metoprolol succinate (TOPROL-XL) 25 MG 24 hr tablet Take 1 tablet (25 mg total) by mouth daily. 06/21/19   Emily Filbert, MD  pravastatin (PRAVACHOL) 10 MG tablet Take 1 tablet (10 mg total) by mouth daily. 06/21/19   Emily Filbert, MD  ranitidine (ZANTAC) 150 MG capsule Take 1 capsule (150 mg total) by mouth 2 (two) times daily. 06/21/19   Emily Filbert, MD  sulfamethoxazole-trimethoprim (BACTRIM DS) 800-160 MG tablet Take 1 tablet by mouth 2 (two) times daily for 7 days. 02/02/20 02/09/20  Orvil Feil, PA-C    Allergies Biaxin  [clarithromycin], Penicillins, and Prednisone  Family History  Problem  Relation Age of Onset  . Cancer Mother        brain cancer  . Coronary artery disease Father   . Heart attack Father     Social History Social History   Tobacco Use  . Smoking status: Former Smoker    Types: Cigarettes, E-cigarettes  . Smokeless tobacco: Never Used  Substance Use Topics  . Alcohol use: No    Alcohol/week: 0.0 standard drinks  . Drug use: No     Review of Systems  Constitutional: No fever/chills Eyes:  No discharge ENT: No upper  respiratory complaints. Respiratory: no cough. No SOB/ use of accessory muscles to breath Gastrointestinal:   No nausea, no vomiting.  No diarrhea.  No constipation. Musculoskeletal: Patient has left index finger pain.  Skin: Patient has laceration.     ____________________________________________   PHYSICAL EXAM:  VITAL SIGNS: ED Triage Vitals [02/02/20 2011]  Enc Vitals Group     BP (!) 148/95     Pulse Rate 93     Resp 19     Temp 99.1 F (37.3 C)     Temp src      SpO2 97 %     Weight 220 lb 0.3 oz (99.8 kg)     Height 5\' 10"  (1.778 m)     Head Circumference      Peak Flow      Pain Score 9     Pain Loc      Pain Edu?      Excl. in GC?      Constitutional: Alert and oriented. Well appearing and in no acute distress. Eyes: Conjunctivae are normal. PERRL. EOMI. Head: Atraumatic. Cardiovascular: Normal rate, regular rhythm. Normal S1 and S2.  Good peripheral circulation. Respiratory: Normal respiratory effort without tachypnea or retractions. Lungs CTAB. Good air entry to the bases with no decreased or absent breath sounds Gastrointestinal: Bowel sounds x 4 quadrants. Soft and nontender to palpation. No guarding or rigidity. No distention. Musculoskeletal: Full range of motion to all extremities. No obvious deformities noted.  No flexor or extensor tendon deficits of the left index finger. Neurologic:  Normal for age. No gross focal neurologic deficits are appreciated.  Skin: Laceration of left index finger is macerated and deep to underlying adipose tissue.  Laceration is localized to distal fingertip is approximately 2 cm in length. Psychiatric: Mood and affect are normal for age. Speech and behavior are normal.   ____________________________________________   LABS (all labs ordered are listed, but only abnormal results are displayed)  Labs Reviewed - No data to  display ____________________________________________  EKG   ____________________________________________  RADIOLOGY , personally viewed and evaluated these images (plain radiographs) as part of my medical decision making, as well as reviewing the written report by the radiologist.  DG Finger Index Left  Result Date: 02/02/2020 CLINICAL DATA:  Power saw injury EXAM: LEFT INDEX FINGER 2+V COMPARISON:  None. FINDINGS: There is a minimally displaced longitudinal fracture of the lateral (thenar) aspect of the tuft of the left second distal phalanx with overlying soft tissue laceration. There is no radiopaque foreign body. IMPRESSION: There is a minimally displaced longitudinal fracture of the lateral (thenar) aspect of the tuft of the left second distal phalanx with overlying soft tissue laceration. Electronically Signed   By: 04/03/2020 M.D.   On: 02/02/2020 20:59    ____________________________________________    PROCEDURES  Procedure(s) performed:     10/07/2021Marland KitchenLaceration Repair  Date/Time: 02/02/2020 9:35 PM Performed  by: Orvil Feil, PA-C Authorized by: Orvil Feil, PA-C   Consent:    Consent obtained:  Verbal   Consent given by:  Patient Anesthesia (see MAR for exact dosages):    Anesthesia method:  None Laceration details:    Location:  Finger   Finger location:  L index finger   Length (cm):  2   Depth (mm):  2 Repair type:    Repair type:  Simple Exploration:    Hemostasis achieved with:  Direct pressure   Wound exploration: wound explored through full range of motion     Wound extent: nerve damage and tendon damage     Contaminated: no   Treatment:    Area cleansed with:  Betadine   Amount of cleaning:  Standard   Irrigation solution:  Sterile saline   Irrigation volume:  500   Visualized foreign bodies/material removed: no   Skin repair:    Repair method:  Tissue adhesive and sutures   Suture size:  4-0   Suture material:  Nylon    Suture technique:  Simple interrupted   Number of sutures:  8 Approximation:    Approximation:  Close Post-procedure details:    Dressing:  Open (no dressing)   Patient tolerance of procedure:  Tolerated well, no immediate complications       Medications  lidocaine (PF) (XYLOCAINE) 1 % injection (has no administration in time range)  Tdap (BOOSTRIX) injection 0.5 mL (has no administration in time range)  fentaNYL (SUBLIMAZE) injection 50 mcg (50 mcg Intramuscular Given 02/02/20 2042)  ondansetron (ZOFRAN-ODT) disintegrating tablet 4 mg (4 mg Oral Given 02/02/20 2041)  lidocaine (XYLOCAINE) 1 % (with pres) injection 10 mL (10 mLs Infiltration Given by Other 02/02/20 2042)     ____________________________________________   INITIAL IMPRESSION / ASSESSMENT AND PLAN / ED COURSE  Pertinent labs & imaging results that were available during my care of the patient were reviewed by me and considered in my medical decision making (see chart for details).    Assessment and plan Distal tuft fracture Laceration 49 year old male presents to the emergency department with acute left index finger pain after sustaining a laceration using a chainsaw.  Patient was hypertensive at triage but vital signs were otherwise reassuring.  X-ray of the left index finger revealed a nondisplaced distal tuft fracture.  Patient's wound was irrigated in the emergency department laceration repair occurred without complication.  He was advised to have sutures removed by primary care in 7 days.  Patient was given a follow-up referral to hand surgeon, Dr. Stephenie Acres.  He was advised to take Bactrim twice daily for the next 7 days.   ____________________________________________  FINAL CLINICAL IMPRESSION(S) / ED DIAGNOSES  Final diagnoses:  Open fracture of tuft of distal phalanx of finger  Laceration of left index finger without foreign body without damage to nail, initial encounter      NEW MEDICATIONS STARTED DURING  THIS VISIT:  ED Discharge Orders         Ordered    sulfamethoxazole-trimethoprim (BACTRIM DS) 800-160 MG tablet  2 times daily     Discontinue  Reprint     02/02/20 2117              This chart was dictated using voice recognition software/Dragon. Despite best efforts to proofread, errors can occur which can change the meaning. Any change was purely unintentional.     Orvil Feil, PA-C 02/02/20 2140    Phineas Semen, MD 02/02/20 2234

## 2020-02-02 NOTE — ED Triage Notes (Signed)
Patient sliced distal index finger left hand with buzz saw. Patient holding pressure to site in triage. Clean bandage applied.

## 2020-02-02 NOTE — Discharge Instructions (Addendum)
Have sutures removed by primary care in seven days.  Please make follow up appointment with Dr. Stephenie Acres.  Take Bactrim twice daily for the next seven days.

## 2020-06-02 ENCOUNTER — Other Ambulatory Visit: Payer: Self-pay

## 2020-06-02 DIAGNOSIS — E669 Obesity, unspecified: Secondary | ICD-10-CM | POA: Diagnosis present

## 2020-06-02 DIAGNOSIS — T380X5A Adverse effect of glucocorticoids and synthetic analogues, initial encounter: Secondary | ICD-10-CM | POA: Diagnosis not present

## 2020-06-02 DIAGNOSIS — Z6831 Body mass index (BMI) 31.0-31.9, adult: Secondary | ICD-10-CM

## 2020-06-02 DIAGNOSIS — J1282 Pneumonia due to coronavirus disease 2019: Secondary | ICD-10-CM | POA: Diagnosis present

## 2020-06-02 DIAGNOSIS — Z7982 Long term (current) use of aspirin: Secondary | ICD-10-CM

## 2020-06-02 DIAGNOSIS — Z794 Long term (current) use of insulin: Secondary | ICD-10-CM

## 2020-06-02 DIAGNOSIS — Z79899 Other long term (current) drug therapy: Secondary | ICD-10-CM

## 2020-06-02 DIAGNOSIS — I25810 Atherosclerosis of coronary artery bypass graft(s) without angina pectoris: Secondary | ICD-10-CM | POA: Diagnosis present

## 2020-06-02 DIAGNOSIS — E1165 Type 2 diabetes mellitus with hyperglycemia: Secondary | ICD-10-CM | POA: Diagnosis present

## 2020-06-02 DIAGNOSIS — Z888 Allergy status to other drugs, medicaments and biological substances status: Secondary | ICD-10-CM

## 2020-06-02 DIAGNOSIS — Z87891 Personal history of nicotine dependence: Secondary | ICD-10-CM

## 2020-06-02 DIAGNOSIS — Z8249 Family history of ischemic heart disease and other diseases of the circulatory system: Secondary | ICD-10-CM

## 2020-06-02 DIAGNOSIS — J9601 Acute respiratory failure with hypoxia: Secondary | ICD-10-CM | POA: Diagnosis present

## 2020-06-02 DIAGNOSIS — K219 Gastro-esophageal reflux disease without esophagitis: Secondary | ICD-10-CM | POA: Diagnosis present

## 2020-06-02 DIAGNOSIS — I10 Essential (primary) hypertension: Secondary | ICD-10-CM | POA: Diagnosis present

## 2020-06-02 DIAGNOSIS — Z955 Presence of coronary angioplasty implant and graft: Secondary | ICD-10-CM

## 2020-06-02 DIAGNOSIS — Z7902 Long term (current) use of antithrombotics/antiplatelets: Secondary | ICD-10-CM

## 2020-06-02 DIAGNOSIS — Z880 Allergy status to penicillin: Secondary | ICD-10-CM

## 2020-06-02 DIAGNOSIS — Y92239 Unspecified place in hospital as the place of occurrence of the external cause: Secondary | ICD-10-CM | POA: Diagnosis present

## 2020-06-02 DIAGNOSIS — U071 COVID-19: Principal | ICD-10-CM | POA: Diagnosis present

## 2020-06-02 DIAGNOSIS — E785 Hyperlipidemia, unspecified: Secondary | ICD-10-CM | POA: Diagnosis present

## 2020-06-03 ENCOUNTER — Emergency Department: Payer: Self-pay

## 2020-06-03 ENCOUNTER — Other Ambulatory Visit: Payer: Self-pay

## 2020-06-03 ENCOUNTER — Encounter: Payer: Self-pay | Admitting: Radiology

## 2020-06-03 ENCOUNTER — Inpatient Hospital Stay
Admission: EM | Admit: 2020-06-03 | Discharge: 2020-06-06 | DRG: 177 | Disposition: A | Discharge status: Home / Self Care | Payer: Self-pay | Attending: Hospitalist | Admitting: Hospitalist

## 2020-06-03 DIAGNOSIS — E785 Hyperlipidemia, unspecified: Secondary | ICD-10-CM | POA: Diagnosis not present

## 2020-06-03 DIAGNOSIS — U071 COVID-19: Secondary | ICD-10-CM | POA: Diagnosis not present

## 2020-06-03 DIAGNOSIS — J9601 Acute respiratory failure with hypoxia: Secondary | ICD-10-CM | POA: Diagnosis present

## 2020-06-03 DIAGNOSIS — J9600 Acute respiratory failure, unspecified whether with hypoxia or hypercapnia: Secondary | ICD-10-CM

## 2020-06-03 DIAGNOSIS — A4189 Other specified sepsis: Secondary | ICD-10-CM

## 2020-06-03 DIAGNOSIS — J1282 Pneumonia due to coronavirus disease 2019: Secondary | ICD-10-CM | POA: Diagnosis not present

## 2020-06-03 LAB — COMPREHENSIVE METABOLIC PANEL
ALT: 43 U/L (ref 0–44)
AST: 48 U/L — ABNORMAL HIGH (ref 15–41)
Albumin: 3.8 g/dL (ref 3.5–5.0)
Alkaline Phosphatase: 87 U/L (ref 38–126)
Anion gap: 13 (ref 5–15)
BUN: 15 mg/dL (ref 6–20)
CO2: 26 mmol/L (ref 22–32)
Calcium: 8.8 mg/dL — ABNORMAL LOW (ref 8.9–10.3)
Chloride: 100 mmol/L (ref 98–111)
Creatinine, Ser: 1.03 mg/dL (ref 0.61–1.24)
GFR, Estimated: >60 mL/min (ref >60)
Glucose, Bld: 233 mg/dL — ABNORMAL HIGH (ref 70–99)
Potassium: 4.1 mmol/L (ref 3.5–5.1)
Sodium: 139 mmol/L (ref 135–145)
Total Bilirubin: 1 mg/dL (ref 0.3–1.2)
Total Protein: 7.9 g/dL (ref 6.5–8.1)

## 2020-06-03 LAB — BRAIN NATRIURETIC PEPTIDE: B Natriuretic Peptide: 163.8 pg/mL — ABNORMAL HIGH (ref 0.0–100.0)

## 2020-06-03 LAB — CBC
HCT: 51.4 % (ref 39.0–52.0)
Hemoglobin: 17.7 g/dL — ABNORMAL HIGH (ref 13.0–17.0)
MCH: 28.9 pg (ref 26.0–34.0)
MCHC: 34.4 g/dL (ref 30.0–36.0)
MCV: 83.8 fL (ref 80.0–100.0)
Platelets: 149 10*3/uL — ABNORMAL LOW (ref 150–400)
RBC: 6.13 MIL/uL — ABNORMAL HIGH (ref 4.22–5.81)
RDW: 12.2 % (ref 11.5–15.5)
WBC: 6 10*3/uL (ref 4.0–10.5)
nRBC: 0 % (ref 0.0–0.2)

## 2020-06-03 LAB — CBG MONITORING, ED
Glucose-Capillary: 249 mg/dL — ABNORMAL HIGH (ref 70–99)
Glucose-Capillary: 303 mg/dL — ABNORMAL HIGH (ref 70–99)

## 2020-06-03 LAB — HEMOGLOBIN A1C
Hgb A1c MFr Bld: 8.8 % — ABNORMAL HIGH (ref 4.8–5.6)
Mean Plasma Glucose: 205.86 mg/dL

## 2020-06-03 LAB — TRIGLYCERIDES: Triglycerides: 133 mg/dL (ref <150)

## 2020-06-03 LAB — PROCALCITONIN: Procalcitonin: <0.1 ng/mL

## 2020-06-03 LAB — LACTATE DEHYDROGENASE: LDH: 218 U/L — ABNORMAL HIGH (ref 98–192)

## 2020-06-03 LAB — FERRITIN: Ferritin: 1082 ng/mL — ABNORMAL HIGH (ref 24–336)

## 2020-06-03 LAB — FIBRIN DERIVATIVES D-DIMER (ARMC ONLY): Fibrin derivatives D-dimer (ARMC): 650.21 ng/mL (FEU) — ABNORMAL HIGH (ref 0.00–499.00)

## 2020-06-03 LAB — C-REACTIVE PROTEIN: CRP: 6.2 mg/dL — ABNORMAL HIGH (ref <1.0)

## 2020-06-03 LAB — FIBRINOGEN: Fibrinogen: 657 mg/dL — ABNORMAL HIGH (ref 210–475)

## 2020-06-03 LAB — LACTIC ACID, PLASMA
Lactic Acid, Venous: 1.1 mmol/L (ref 0.5–1.9)
Lactic Acid, Venous: 1.9 mmol/L (ref 0.5–1.9)

## 2020-06-03 LAB — GLUCOSE, CAPILLARY: Glucose-Capillary: 174 mg/dL — ABNORMAL HIGH (ref 70–99)

## 2020-06-03 LAB — TROPONIN I (HIGH SENSITIVITY)
Troponin I (High Sensitivity): 5 ng/L (ref <18)
Troponin I (High Sensitivity): 8 ng/L (ref <18)

## 2020-06-03 LAB — HIV ANTIBODY (ROUTINE TESTING W REFLEX): HIV Screen 4th Generation wRfx: NONREACTIVE

## 2020-06-03 MED ORDER — CLOPIDOGREL BISULFATE 75 MG PO TABS
75.0000 mg | ORAL_TABLET | Freq: Every day | ORAL | Status: DC
  Administered 2020-06-03 – 2020-06-06 (×4): 75 mg via ORAL
  Filled 2020-06-03 (×4): qty 1

## 2020-06-03 MED ORDER — SODIUM CHLORIDE 0.9 % IV SOLN: INTRAVENOUS | Status: DC

## 2020-06-03 MED ORDER — GABAPENTIN 300 MG PO CAPS
300.0000 mg | ORAL_CAPSULE | Freq: Three times a day (TID) | ORAL | Status: DC
  Administered 2020-06-03 – 2020-06-06 (×10): 300 mg via ORAL
  Filled 2020-06-03 (×10): qty 1

## 2020-06-03 MED ORDER — INSULIN DETEMIR 100 UNIT/ML ~~LOC~~ SOLN
15.0000 [IU] | Freq: Every day | SUBCUTANEOUS | Status: DC
  Administered 2020-06-03: 15 [IU] via SUBCUTANEOUS
  Filled 2020-06-03 (×2): qty 0.15

## 2020-06-03 MED ORDER — GUAIFENESIN ER 600 MG PO TB12
600.0000 mg | ORAL_TABLET | Freq: Two times a day (BID) | ORAL | Status: DC
  Administered 2020-06-03 – 2020-06-06 (×8): 600 mg via ORAL
  Filled 2020-06-03 (×8): qty 1

## 2020-06-03 MED ORDER — PREDNISONE 50 MG PO TABS: 50.0000 mg | ORAL_TABLET | Freq: Every day | ORAL | Status: DC

## 2020-06-03 MED ORDER — ONDANSETRON HCL 4 MG PO TABS: 4.0000 mg | ORAL_TABLET | Freq: Four times a day (QID) | ORAL | Status: DC | PRN

## 2020-06-03 MED ORDER — INSULIN ASPART 100 UNIT/ML ~~LOC~~ SOLN
4.0000 [IU] | Freq: Three times a day (TID) | SUBCUTANEOUS | Status: DC
  Administered 2020-06-04 – 2020-06-06 (×8): 4 [IU] via SUBCUTANEOUS
  Filled 2020-06-03 (×9): qty 1

## 2020-06-03 MED ORDER — METOPROLOL SUCCINATE ER 25 MG PO TB24
25.0000 mg | ORAL_TABLET | Freq: Every day | ORAL | Status: DC
  Administered 2020-06-03 – 2020-06-05 (×3): 25 mg via ORAL
  Filled 2020-06-03 (×4): qty 1

## 2020-06-03 MED ORDER — METHYLPREDNISOLONE SODIUM SUCC 125 MG IJ SOLR
1.0000 mg/kg | Freq: Two times a day (BID) | INTRAMUSCULAR | Status: DC
  Administered 2020-06-03 – 2020-06-05 (×4): 100 mg via INTRAVENOUS
  Filled 2020-06-03 (×4): qty 2

## 2020-06-03 MED ORDER — VITAMIN D 25 MCG (1000 UNIT) PO TABS
1000.0000 [IU] | ORAL_TABLET | Freq: Every day | ORAL | Status: DC
  Administered 2020-06-03 – 2020-06-06 (×4): 1000 [IU] via ORAL
  Filled 2020-06-03 (×4): qty 1

## 2020-06-03 MED ORDER — GUAIFENESIN-DM 100-10 MG/5ML PO SYRP: 10.0000 mL | ORAL_SOLUTION | ORAL | Status: DC | PRN

## 2020-06-03 MED ORDER — FAMOTIDINE 20 MG PO TABS: 20.0000 mg | ORAL_TABLET | Freq: Two times a day (BID) | ORAL | Status: DC

## 2020-06-03 MED ORDER — ENOXAPARIN SODIUM 60 MG/0.6ML ~~LOC~~ SOLN
0.5000 mg/kg | SUBCUTANEOUS | Status: DC
  Administered 2020-06-03 – 2020-06-05 (×3): 50 mg via SUBCUTANEOUS
  Filled 2020-06-03 (×3): qty 0.6

## 2020-06-03 MED ORDER — ZINC SULFATE 220 (50 ZN) MG PO CAPS
220.0000 mg | ORAL_CAPSULE | Freq: Every day | ORAL | Status: DC
  Administered 2020-06-03 – 2020-06-06 (×4): 220 mg via ORAL
  Filled 2020-06-03 (×4): qty 1

## 2020-06-03 MED ORDER — SODIUM CHLORIDE 0.9 % IV SOLN
100.0000 mg | Freq: Every day | INTRAVENOUS | Status: DC
  Administered 2020-06-04 – 2020-06-06 (×3): 100 mg via INTRAVENOUS
  Filled 2020-06-03: qty 20
  Filled 2020-06-03: qty 100
  Filled 2020-06-03: qty 20

## 2020-06-03 MED ORDER — ACETAMINOPHEN 325 MG PO TABS
650.0000 mg | ORAL_TABLET | Freq: Four times a day (QID) | ORAL | Status: DC | PRN
  Administered 2020-06-04 – 2020-06-05 (×3): 650 mg via ORAL
  Filled 2020-06-03 (×3): qty 2

## 2020-06-03 MED ORDER — TRAZODONE HCL 50 MG PO TABS: 25.0000 mg | ORAL_TABLET | Freq: Every evening | ORAL | Status: DC | PRN

## 2020-06-03 MED ORDER — LACTATED RINGERS IV BOLUS (SEPSIS)
1000.0000 mL | Freq: Once | INTRAVENOUS | Status: AC
  Administered 2020-06-03: 1000 mL via INTRAVENOUS

## 2020-06-03 MED ORDER — IBUPROFEN 600 MG PO TABS
600.0000 mg | ORAL_TABLET | Freq: Once | ORAL | Status: AC
  Administered 2020-06-03: 600 mg via ORAL
  Filled 2020-06-03: qty 1

## 2020-06-03 MED ORDER — PRAVASTATIN SODIUM 20 MG PO TABS
10.0000 mg | ORAL_TABLET | Freq: Every day | ORAL | Status: DC
  Administered 2020-06-03 – 2020-06-05 (×3): 10 mg via ORAL
  Filled 2020-06-03 (×4): qty 1

## 2020-06-03 MED ORDER — BARICITINIB 2 MG PO TABS
4.0000 mg | ORAL_TABLET | Freq: Every day | ORAL | Status: DC
  Administered 2020-06-03 – 2020-06-04 (×2): 4 mg via ORAL
  Filled 2020-06-03 (×2): qty 2

## 2020-06-03 MED ORDER — HYDROCOD POLST-CPM POLST ER 10-8 MG/5ML PO SUER
5.0000 mL | Freq: Two times a day (BID) | ORAL | Status: DC | PRN
  Administered 2020-06-05: 5 mL via ORAL
  Filled 2020-06-03: qty 5

## 2020-06-03 MED ORDER — INSULIN ASPART 100 UNIT/ML ~~LOC~~ SOLN
0.0000 [IU] | Freq: Every day | SUBCUTANEOUS | Status: DC
  Administered 2020-06-04 – 2020-06-05 (×2): 2 [IU] via SUBCUTANEOUS
  Filled 2020-06-03 (×2): qty 1

## 2020-06-03 MED ORDER — MAGNESIUM HYDROXIDE 400 MG/5ML PO SUSP
30.0000 mL | Freq: Every day | ORAL | Status: DC | PRN
  Filled 2020-06-03: qty 30

## 2020-06-03 MED ORDER — ONDANSETRON HCL 4 MG/2ML IJ SOLN: 4.0000 mg | Freq: Four times a day (QID) | INTRAMUSCULAR | Status: DC | PRN

## 2020-06-03 MED ORDER — ASCORBIC ACID 500 MG PO TABS
500.0000 mg | ORAL_TABLET | Freq: Every day | ORAL | Status: DC
  Administered 2020-06-03 – 2020-06-06 (×4): 500 mg via ORAL
  Filled 2020-06-03 (×4): qty 1

## 2020-06-03 MED ORDER — DEXAMETHASONE SODIUM PHOSPHATE 10 MG/ML IJ SOLN
10.0000 mg | Freq: Once | INTRAMUSCULAR | Status: AC
  Administered 2020-06-03: 10 mg via INTRAVENOUS
  Filled 2020-06-03: qty 1

## 2020-06-03 MED ORDER — INSULIN DETEMIR 100 UNIT/ML ~~LOC~~ SOLN
15.0000 [IU] | Freq: Two times a day (BID) | SUBCUTANEOUS | Status: DC
  Administered 2020-06-03 – 2020-06-06 (×6): 15 [IU] via SUBCUTANEOUS
  Filled 2020-06-03 (×7): qty 0.15

## 2020-06-03 MED ORDER — FAMOTIDINE 20 MG PO TABS
20.0000 mg | ORAL_TABLET | Freq: Two times a day (BID) | ORAL | Status: DC
  Administered 2020-06-03 – 2020-06-06 (×8): 20 mg via ORAL
  Filled 2020-06-03 (×8): qty 1

## 2020-06-03 MED ORDER — IBUPROFEN 400 MG PO TABS
400.0000 mg | ORAL_TABLET | Freq: Four times a day (QID) | ORAL | Status: DC | PRN
  Administered 2020-06-05: 22:00:00 400 mg via ORAL
  Filled 2020-06-03: qty 1

## 2020-06-03 MED ORDER — INSULIN ASPART 100 UNIT/ML ~~LOC~~ SOLN
0.0000 [IU] | Freq: Three times a day (TID) | SUBCUTANEOUS | Status: DC
  Administered 2020-06-03: 7 [IU] via SUBCUTANEOUS
  Administered 2020-06-03: 15 [IU] via SUBCUTANEOUS
  Administered 2020-06-04: 08:00:00 11 [IU] via SUBCUTANEOUS
  Administered 2020-06-04: 17:00:00 4 [IU] via SUBCUTANEOUS
  Administered 2020-06-04: 13:00:00 7 [IU] via SUBCUTANEOUS
  Administered 2020-06-05: 11 [IU] via SUBCUTANEOUS
  Administered 2020-06-05: 10:00:00 4 [IU] via SUBCUTANEOUS
  Administered 2020-06-05: 13:00:00 11 [IU] via SUBCUTANEOUS
  Administered 2020-06-06 (×2): 7 [IU] via SUBCUTANEOUS
  Filled 2020-06-03 (×9): qty 1

## 2020-06-03 MED ORDER — SODIUM CHLORIDE 0.9 % IV SOLN
200.0000 mg | Freq: Once | INTRAVENOUS | Status: AC
  Administered 2020-06-03: 200 mg via INTRAVENOUS
  Filled 2020-06-03: qty 200

## 2020-06-03 NOTE — H&P (Signed)
[place]   PATIENT NAME: Scott [name]    MR#:  [id number]  DATE OF BIRTH:  1970-11-11  DATE OF ADMISSION:  06/03/2020  PRIMARY CARE PHYSICIAN: [facility]   REQUESTING/REFERRING PHYSICIAN: Loleta Rose, MD  CHIEF COMPLAINT:   Chief Complaint  Patient presents with  . Fever  . Shortness of Breath    HISTORY OF PRESENT ILLNESS:  Scott [name]  is a 49 y.o. Caucasian male with a known history of coronary artery disease, type diabetes mellitus, GERD, dyslipidemia and hypertension, presented to the emergency room with a concern of URI symptoms that started initially with sneezing and nasal congestion with rhinorrhea for which she get COVID-19 PCR test that came back positive as his wife also tested positive prior to that.  He later had body aches and headaches and high fever with a temperature of 102.7.  He had nausea and vomiting last night.  Since Thursday he has been having cough mainly dry and occasionally productive of clear sputum with associated dyspnea and occasional wheezing.  No chest pain or palpitations.  No dysuria, oliguria or hematuria or flank pain.  He denies any diarrhea. No wheeze vomitus or hematemesis.  No melena or bright red bleeding per rectum.  Upon presentation to the emergency room, temperature was 103.2 and blood pressure 141/103 with a heart rate of 127 respiratory to 26 and post surgery was 90% on room air and dropped to 89% on room air then was up to 98-99% on 2 L of O2 by nasal cannula.  Labs revealed hyperglycemia of 233 with AST 48 and otherwise unremarkable CMP.  BNP was 163.8 and high-sensitivity troponin I was 5 with LDH of 218 ferritin 1082 and procalcitonin less than 0.1 with CRP of 6.2 and CBC showed hemoconcentration.  Fibrin derivatives D-dimer was 650.21 and fibrinogen 657.  Blood cultures were drawn.  Portable chest ray showed hazy airspace opacity in the retrocardiac region could be due to atelectasis and/or  infectious etiology.EKG showed sinus tachycardia with rate of 125 with right atrial enlargement.  The patient was given a liter bolus of IV lactated Ringer, 600 mg of p.o. ibuprofen, 6 mg of IV Decadron, and IV remdesivir.  He will be admitted to an isolation medically monitored bed for further evaluation and management. PAST MEDICAL HISTORY:   Past Medical History:  Diagnosis Date  . Coronary artery disease involving coronary bypass graft of native heart without angina pectoris   . Diabetes mellitus without complication (HCC)   . Elevated liver enzymes   . GERD (gastroesophageal reflux disease)   . Hyperlipidemia   . Hypertension     PAST SURGICAL HISTORY:   Past Surgical History:  Procedure Laterality Date  . APPENDECTOMY    . CARDIAC CATHETERIZATION  07/31/2007    SOCIAL HISTORY:   Social History   Tobacco Use  . Smoking status: Former Smoker    Types: Cigarettes, E-cigarettes  . Smokeless tobacco: Never Used  Substance Use Topics  . Alcohol use: No    Alcohol/week: 0.0 standard drinks    FAMILY HISTORY:   Family History  Problem Relation Age of Onset  . Cancer Mother        brain cancer  . Coronary artery disease Father   . Heart attack Father     DRUG ALLERGIES:   Allergies  Allergen Reactions  . Biaxin  [Clarithromycin]     CAUSE RASH  . Penicillins     CAUSE RASH  .  Prednisone     FEEL DIZZY    REVIEW OF SYSTEMS:   ROS As per history of present illness. All pertinent systems were reviewed above. Constitutional, HEENT, cardiovascular, respiratory, GI, GU, musculoskeletal, neuro, psychiatric, endocrine, integumentary and hematologic systems were reviewed and are otherwise negative/unremarkable except for positive findings mentioned above in the HPI.   MEDICATIONS AT HOME:   Prior to Admission medications   Medication Sig Start Date End Date Taking? Authorizing Provider  aspirin EC 81 MG tablet Take 1 tablet (81 mg total) by mouth daily. 06/21/19    Emily Filbert, MD  clopidogrel (PLAVIX) 75 MG tablet Take 1 tablet (75 mg total) by mouth daily. 06/21/19   Emily Filbert, MD  gabapentin (NEURONTIN) 300 MG capsule Take 1 capsule (300 mg total) by mouth 3 (three) times daily. 06/21/19   Emily Filbert, MD  insulin detemir (LEVEMIR) 100 UNIT/ML injection Inject 0.15 mLs (15 Units total) into the skin at bedtime. 06/21/19   Emily Filbert, MD  Insulin Pen Needle (NOVOFINE PLUS) 32G X 4 MM MISC 1 Device by Does not apply route daily. 06/21/19   Emily Filbert, MD  lisinopril (ZESTRIL) 20 MG tablet Take 1 tablet (20 mg total) by mouth daily. 06/21/19   Emily Filbert, MD  metoprolol succinate (TOPROL-XL) 25 MG 24 hr tablet Take 1 tablet (25 mg total) by mouth daily. 06/21/19   Emily Filbert, MD  pravastatin (PRAVACHOL) 10 MG tablet Take 1 tablet (10 mg total) by mouth daily. 06/21/19   Emily Filbert, MD  ranitidine (ZANTAC) 150 MG capsule Take 1 capsule (150 mg total) by mouth 2 (two) times daily. 06/21/19   Emily Filbert, MD      VITAL SIGNS:  Blood pressure (!) 129/91, pulse 82, temperature 98.8 F (37.1 C), temperature source Oral, resp. rate (!) 32, height 5\' 10"  (1.778 m), weight 99.8 kg, SpO2 98 %.  PHYSICAL EXAMINATION:  Physical Exam  GENERAL:  49 y.o.-year-old Caucasian male patient lying in the bed with mild respiratory stress with conversational dyspnea.   EYES: Pupils equal, round, reactive to light and accommodation. No scleral icterus. Extraocular muscles intact.  HEENT: Head atraumatic, normocephalic. Oropharynx and nasopharynx clear.  NECK:  Supple, no jugular venous distention. No thyroid enlargement, no tenderness.  LUNGS: Diminished bibasal breath sounds with bibasal crackles. CARDIOVASCULAR: Regular rate and rhythm, S1, S2 normal. No murmurs, rubs, or gallops.  ABDOMEN: Soft, nondistended, nontender. Bowel sounds present. No organomegaly or mass.  EXTREMITIES: No  pedal edema, cyanosis, or clubbing.  NEUROLOGIC: Cranial nerves II through XII are intact. Muscle strength 5/5 in all extremities. Sensation intact. Gait not checked.  PSYCHIATRIC: The patient is alert and oriented x 3.  Normal affect and good eye contact. SKIN: No obvious rash, lesion, or ulcer.   LABORATORY PANEL:   CBC Recent Labs  Lab 06/03/20 0008  WBC 6.0  HGB 17.7*  HCT 51.4  PLT 149*   ------------------------------------------------------------------------------------------------------------------  Chemistries  Recent Labs  Lab 06/03/20 0008  NA 139  K 4.1  CL 100  CO2 26  GLUCOSE 233*  BUN 15  CREATININE 1.03  CALCIUM 8.8*  AST 48*  ALT 43  ALKPHOS 87  BILITOT 1.0   ------------------------------------------------------------------------------------------------------------------  Cardiac Enzymes No results for input(s): TROPONINI in the last 168 hours. ------------------------------------------------------------------------------------------------------------------  RADIOLOGY:  DG Chest Portable 1 View  Result Date: 06/03/2020 CLINICAL DATA:  Shortness of breath EXAM: PORTABLE CHEST 1 VIEW COMPARISON:  None. FINDINGS: The  heart size and mediastinal contours are within normal limits. Overall shallow degree of aeration. There is hazy patchy airspace opacity seen within the left retrocardiac region. The visualized skeletal structures are unremarkable. IMPRESSION: Hazy airspace opacity in the retrocardiac region which could be due to atelectasis and/or infectious etiology. Electronically Signed   By: Jonna Clark M.D.   On: 06/03/2020 00:35      IMPRESSION AND PLAN:  1.  Acute hypoxemic respiratory failure secondary to COVID-19. -The patient will be admitted to a medically monitored isolation bed. -O2 protocol will be followed to keep O2 saturation above 93.   2.  Multifocal pneumonia and subsequent sepsis secondary to COVID-19.  He has no severe sepsis or  septic shock.  Sepsis manifested by -The patient will be admitted to an isolation monitored bed with droplet and contact precautions. -Given multifocal pneumonia we will empirically place the patient on IV Rocephin and Zithromax for possible bacterial superinfection only with elevated Procalcitonin. -The patient will be placed on scheduled Mucinex and as needed Tussionex. -We will avoid nebulization as much as we can, give bronchodilator MDI if needed, and with deterioration of oxygenation try to avoid BiPAP/CPAP if possible.    -Will obtain sputum Gram stain culture and sensitivity and follow blood cultures. -O2 protocol will be followed. -We will follow CRP, ferritin, LDH and D-dimer. -Will follow manual differential for ANC/ALC ratio as well as follow troponin I and daily CBC with manual differential and CMP. - Will place the patient on IV Remdesivir and IV steroid therapy with IV Solu-Medrol with elevated inflammatory markers. -The patient will be placed on vitamin D3, vitamin C, zinc sulfate, p.o. Pepcid and aspirin. -I discussed Baricitinib and the patient agreed to proceed with it.  3.  Dyslipidemia. -The patient will be continued on statin therapy.  4.  Type 2 diabetes mellitus. -We will place him on supplemental coverage with NovoLog and continue basal coverage.  5.  Essential hypertension. -Zestril will be continued and Toprol-XL.  6.  DVT prophylaxis. -Subcutaneous Lovenox.   All the records are reviewed and case discussed with ED provider. The plan of care was discussed in details with the patient (and family). I answered all questions. The patient agreed to proceed with the above mentioned plan. Further management will depend upon hospital course.   CODE STATUS: Full code  Status is: Inpatient  Remains inpatient appropriate because:Ongoing diagnostic testing needed not appropriate for outpatient work up, Unsafe d/c plan, IV treatments appropriate due to intensity of  illness or inability to take PO and Inpatient level of care appropriate due to severity of illness   Dispo: The patient is from: Home              Anticipated d/c is to: Home              Anticipated d/c date is: 3 days              Patient currently is not medically stable to d/c.   TOTAL TIME TAKING CARE OF THIS PATIENT: 55 minutes.    Hannah Beat M.D on 06/03/2020 at 4:38 AM  [facility]   From 7 PM-7 AM, contact night-coverage [email or web address]  CC: Primary care physician; [facility]

## 2020-06-03 NOTE — Progress Notes (Signed)
Inpatient Diabetes Program Recommendations  AACE/ADA: New Consensus Statement on Inpatient Glycemic Control (2015)  Target Ranges:  Prepandial:   less than 140 mg/dL      Peak postprandial:   less than 180 mg/dL (1-2 hours)      Critically ill patients:  140 - 180 mg/dL   Lab Results  Component Value Date   GLUCAP 303 (H) 06/03/2020   HGBA1C 8.8 (H) 06/03/2020    Review of Glycemic Control Results for Peugh, Scott [name] (MRN [id number]) as of 06/03/2020 13:39  Ref. Range 06/03/2020 08:35 06/03/2020 12:38  Glucose-Capillary Latest Ref Range: 70 - 99 mg/dL 902 (H) 409 (H)   Diabetes history: DM 2 Outpatient Diabetes medications:  Levemir 15 units q HS Current orders for Inpatient glycemic control:  Novolog resistant tid with meals and HS Levemir 15 units q HS Solumedrol 100 mg q 12 hours Inpatient Diabetes Program Recommendations:   Consider increasing Levemir 15 units bid.  Also consider adding Novolog meal coverage 4 units tid with meals (hold if patient eats less than 50% or NPO).   Thanks  Beryl Meager, RN, BC-ADM Inpatient Diabetes Coordinator Pager [phone] (8a-5p)

## 2020-06-03 NOTE — Hospital Course (Signed)
49 y.o. Caucasian male with a known history of coronary artery disease, type diabetes mellitus, GERD, dyslipidemia and hypertension, presented to the on 06/03/20 with upper respiratory symptoms and a having tested positive for Covid-19.  He had also had high fevers, mostly dry cough, dyspnea and intermittent wheezing.  On arrival, Temp 103.2 F, HR 127, RR 26, maintaining O2 sats on room air.  CXR with hazy opacity of retrocardiac region of possibly infectious etiology.  Admitted for further management. On remdesivir, Solu-medrol, baricitinib, Iand supportive care.

## 2020-06-03 NOTE — ED Triage Notes (Addendum)
Pt in with co shob states tested positive for covid on Thursday. Today noted worsening fever, body aches, and shob. Pt had tylenol 3 hrs pta.

## 2020-06-03 NOTE — ED Provider Notes (Signed)
[facility] Emergency Department Provider Note  ____________________________________________   First MD Initiated Contact with Patient 06/03/20 0021     (approximate)  I have reviewed the triage vital signs and the nursing notes.   HISTORY  Chief Complaint Fever and Shortness of Breath  Level 5 caveat:  history/ROS limited by acute/critical illness  HPI Scott [name] is a 49 y.o. male with medical history as listed below which notably includes coronary artery disease at a young age requiring 2 cardiac stents.  He also has a history of hypertension, hyperlipidemia, diabetes, and obesity.  He is unvaccinated for COVID-19.  He presents by private vehicle for evaluation of worsening COVID-19 symptoms in the setting of positive COVID-19 diagnosis as an outpatient about 5 days ago.  He specifically complains of fever/chills, generalized body aches, joint pain, shortness of breath, and cough.  He has not been able to eat or drink very much.  He has had some nausea and vomiting as of today at which point his symptoms got substantially worse.   Nothing in particular makes his symptoms better and exertion makes all of his symptoms worse.  His wife was also diagnosed last week and she received monoclonal antibody treatment on the same day that he was tested.  He says that he investigated whether or not he can get monoclonal antibodies at [facility], where he has gotten care in the past, but he claims that he was told that "I did not need them".  He is having generalized pain in his chest but without any specific location.  No abdominal pain.  No dysuria.        Past Medical History:  Diagnosis Date  . Coronary artery disease involving coronary bypass graft of native heart without angina pectoris   . Diabetes mellitus without complication (HCC)   . Elevated liver enzymes   . GERD (gastroesophageal reflux disease)   . Hyperlipidemia   . Hypertension     Patient Active  Problem List   Diagnosis Date Noted  . Angina pectoris (HCC) 08/24/2016  . Left flank pain 01/30/2015  . Hematuria 01/30/2015  . Strain of lumbar paraspinal muscle 01/30/2015  . CAD in native artery 12/05/2014  . Gastro-esophageal reflux disease without esophagitis 12/05/2014  . Adiposity 12/05/2014  . Peripheral autonomic neuropathy 12/05/2014  . Diabetes (HCC) 12/05/2014  . Abnormal liver enzymes 07/31/2007  . HLD (hyperlipidemia) 07/31/2007  . BP (high blood pressure) 07/31/2007  . Diabetes mellitus type 2, uncontrolled (HCC) 07/31/2007    Past Surgical History:  Procedure Laterality Date  . APPENDECTOMY    . CARDIAC CATHETERIZATION  07/31/2007    Prior to Admission medications   Medication Sig Start Date End Date Taking? Authorizing Provider  aspirin EC 81 MG tablet Take 1 tablet (81 mg total) by mouth daily. 06/21/19   Emily Filbert, MD  clopidogrel (PLAVIX) 75 MG tablet Take 1 tablet (75 mg total) by mouth daily. 06/21/19   Emily Filbert, MD  gabapentin (NEURONTIN) 300 MG capsule Take 1 capsule (300 mg total) by mouth 3 (three) times daily. 06/21/19   Emily Filbert, MD  insulin detemir (LEVEMIR) 100 UNIT/ML injection Inject 0.15 mLs (15 Units total) into the skin at bedtime. 06/21/19   Emily Filbert, MD  Insulin Pen Needle (NOVOFINE PLUS) 32G X 4 MM MISC 1 Device by Does not apply route daily. 06/21/19   Emily Filbert, MD  lisinopril (ZESTRIL) 20 MG tablet Take 1 tablet (20 mg total) by  mouth daily. 06/21/19   Emily FilbertWilliams, Jonathan E, MD  metoprolol succinate (TOPROL-XL) 25 MG 24 hr tablet Take 1 tablet (25 mg total) by mouth daily. 06/21/19   Emily FilbertWilliams, Jonathan E, MD  pravastatin (PRAVACHOL) 10 MG tablet Take 1 tablet (10 mg total) by mouth daily. 06/21/19   Emily FilbertWilliams, Jonathan E, MD  ranitidine (ZANTAC) 150 MG capsule Take 1 capsule (150 mg total) by mouth 2 (two) times daily. 06/21/19   Emily FilbertWilliams, Jonathan E, MD    Allergies Biaxin   [clarithromycin], Penicillins, and Prednisone  Family History  Problem Relation Age of Onset  . Cancer Mother        brain cancer  . Coronary artery disease Father   . Heart attack Father     Social History Social History   Tobacco Use  . Smoking status: Former Smoker    Types: Cigarettes, E-cigarettes  . Smokeless tobacco: Never Used  Substance Use Topics  . Alcohol use: No    Alcohol/week: 0.0 standard drinks  . Drug use: No    Review of Systems Constitutional: +fever/chills Eyes: No visual changes. ENT: No sore throat. Cardiovascular: +chest pain. Respiratory: +shortness of breath. Gastrointestinal: Nausea and vomiting as of today.  No abdominal pain. Genitourinary: Negative for dysuria. Musculoskeletal: Body aches and joint pain.  Negative for neck pain.  Negative for back pain. Integumentary: Negative for rash. Neurological: Negative for headaches, focal weakness or numbness.   ____________________________________________   PHYSICAL EXAM:  VITAL SIGNS: ED Triage Vitals  Enc Vitals Group     BP 06/02/20 2359 (!) 141/103     Pulse Rate 06/02/20 2359 (!) 127     Resp 06/02/20 2359 (!) 26     Temp 06/02/20 2359 (!) 103.2 F (39.6 C)     Temp Source 06/02/20 2359 Oral     SpO2 06/02/20 2359 92 %     Weight 06/03/20 0001 99.8 kg (220 lb)     Height 06/03/20 0001 1.778 m (5\' 10" )     Head Circumference --      Peak Flow --      Pain Score 06/03/20 0001 9     Pain Loc --      Pain Edu? --      Excl. in GC? --     Constitutional: Alert and oriented.  Ill-appearing but nontoxic. Eyes: Conjunctivae are normal.  Head: Atraumatic. Nose: No congestion/rhinnorhea. Mouth/Throat: Patient is wearing a mask. Neck: No stridor.  No meningeal signs.   Cardiovascular: Tachycardia, regular rhythm. Good peripheral circulation. Grossly normal heart sounds. Respiratory: Tachypnea, occasional cough, some mild coarse breath sounds and minimal wheezing. Gastrointestinal:  Soft and nontender. No distention.  Musculoskeletal: No lower extremity tenderness nor edema. No gross deformities of extremities. Neurologic:  Normal speech and language. No gross focal neurologic deficits are appreciated.  Skin:  Skin is warm, dry and intact. Psychiatric: Mood and affect are normal. Speech and behavior are normal.  ____________________________________________   LABS (all labs ordered are listed, but only abnormal results are displayed)  Labs Reviewed  CBC - Abnormal; Notable for the following components:      Result Value   RBC 6.13 (*)    Hemoglobin 17.7 (*)    Platelets 149 (*)    All other components within normal limits  COMPREHENSIVE METABOLIC PANEL - Abnormal; Notable for the following components:   Glucose, Bld 233 (*)    Calcium 8.8 (*)    AST 48 (*)    All other components within normal  limits  FIBRINOGEN - Abnormal; Notable for the following components:   Fibrinogen 657 (*)    All other components within normal limits  CULTURE, BLOOD (ROUTINE X 2)  CULTURE, BLOOD (ROUTINE X 2)  LACTIC ACID, PLASMA  TRIGLYCERIDES  LACTIC ACID, PLASMA  FIBRIN DERIVATIVES D-DIMER ([facility] ONLY)  PROCALCITONIN  LACTATE DEHYDROGENASE  FERRITIN  C-REACTIVE PROTEIN  TROPONIN I (HIGH SENSITIVITY)   ____________________________________________  EKG  ED ECG REPORT I, Loleta Rose, the attending physician, personally viewed and interpreted this ECG.  Date: 06/02/2020 EKG Time: 23: 55 Rate: 125 Rhythm: Sinus tachycardia QRS Axis: normal Intervals: normal ST/T Wave abnormalities: No ST segment nor T wave changes suggestive of ischemia. Narrative Interpretation: no definitive evidence of acute ischemia; does not meet STEMI criteria.   ____________________________________________  RADIOLOGY I, Loleta Rose, personally viewed and evaluated these images (plain radiographs) as part of my medical decision making, as well as reviewing the written report by the  radiologist.  ED MD interpretation: Retrocardiac opacity likely infectious.  Official radiology report(s): DG Chest Portable 1 View  Result Date: 06/03/2020 CLINICAL DATA:  Shortness of breath EXAM: PORTABLE CHEST 1 VIEW COMPARISON:  None. FINDINGS: The heart size and mediastinal contours are within normal limits. Overall shallow degree of aeration. There is hazy patchy airspace opacity seen within the left retrocardiac region. The visualized skeletal structures are unremarkable. IMPRESSION: Hazy airspace opacity in the retrocardiac region which could be due to atelectasis and/or infectious etiology. Electronically Signed   By: Jonna Clark M.D.   On: 06/03/2020 00:35    ____________________________________________   PROCEDURES   Procedure(s) performed (including Critical Care):  .Critical Care Performed by: Loleta Rose, MD Authorized by: Loleta Rose, MD   Critical care provider statement:    Critical care time (minutes):  45   Critical care time was exclusive of:  Separately billable procedures and treating other patients   Critical care was necessary to treat or prevent imminent or life-threatening deterioration of the following conditions:  Respiratory failure   Critical care was time spent personally by me on the following activities:  Development of treatment plan with patient or surrogate, discussions with consultants, evaluation of patient's response to treatment, examination of patient, obtaining history from patient or surrogate, ordering and performing treatments and interventions, ordering and review of laboratory studies, ordering and review of radiographic studies, pulse oximetry, re-evaluation of patient's condition and review of old charts .1-3 Lead EKG Interpretation Performed by: Loleta Rose, MD Authorized by: Loleta Rose, MD     Interpretation: abnormal     ECG rate:  103   ECG rate assessment: tachycardic     Rhythm: sinus tachycardia     Ectopy: none      Conduction: normal       ____________________________________________   INITIAL IMPRESSION / MDM / ASSESSMENT AND PLAN / ED COURSE  As part of my medical decision making, I reviewed the following data within the electronic MEDICAL RECORD NUMBER History obtained from family, Nursing notes reviewed and incorporated, Labs reviewed , EKG interpreted , Old chart reviewed, Radiograph reviewed , Discussed with admitting physician (Dr. Arville Care) and Notes from prior ED visits   Differential diagnosis includes, but is not limited to, acute respiratory failure secondary to COVID-19, bacterial pneumonia, PE, ACS, pneumothorax.  Patient technically meets criteria for sepsis but he has a known COVID-19 diagnosis which I confirmed and care everywhere.  His oxygen dropped to 89% especially with standing and walking a short distance.  He is very tachypneic  even at rest and was tachycardic as high as the 140s although he has settled down now that he is able to rest on 2 L of oxygen by nasal cannula.  Ibuprofen 600 mg by mouth 4 fever and pain.  CBC is unremarkable other than hemoconcentration with a hemoglobin of 17.7.  Given his tachycardia and decreased oral intake I am giving lactated Ringer's 1 L normal saline IV bolus.  Comprehensive metabolic panel is generally reassuring with no acute abnormalities.  High-sensitivity troponin is normal.  Lactic acid is 1.9.  I personally reviewed the patient's imaging and agree with the radiologist's interpretation that there is likely an infectious etiology and in the setting of a known COVID-19 diagnosis this is likely COVID pneumonia.  EKG shows tachycardia in the 120s but no evidence of ischemia.  The patient is on the cardiac monitor to evaluate for evidence of arrhythmia and/or significant heart rate changes.  Given the patient's vital sign abnormalities and hypoxemia in the setting of COVID-19, I believe he would benefit from inpatient treatment.  I ordered remdesivir  per pharmacy consult and Decadron 10 mg IV given the chest x-ray changes and hypoxemia.  I explained to him that he may be eligible for monoclonal antibodies which will be at the discretion of the hospitalist service as well as availability since there is currently a statewide shortage.  He states that he understands.     Clinical Course as of Jun 04 123  Tue Jun 03, 2020  0103 consulting hospitalist for admission   [CF]  0123 Discussed case by phone with Dr. Arville Care who will admit   [CF]    Clinical Course User Index [CF] Loleta Rose, MD     ____________________________________________  FINAL CLINICAL IMPRESSION(S) / ED DIAGNOSES  Final diagnoses:  Acute respiratory failure due to COVID-19 [facility])     MEDICATIONS GIVEN DURING THIS VISIT:  Medications  dexamethasone (DECADRON) injection 10 mg (has no administration in time range)  remdesivir 200 mg in sodium chloride 0.9% 250 mL IVPB (has no administration in time range)    Followed by  remdesivir 100 mg in sodium chloride 0.9 % 100 mL IVPB (has no administration in time range)  ibuprofen (ADVIL) tablet 600 mg (600 mg Oral Given 06/03/20 0028)  lactated ringers bolus 1,000 mL (1,000 mLs Intravenous New Bag/Given 06/03/20 0035)     ED Discharge Orders    None      *Please note:  Scott [name] was evaluated in Emergency Department on 06/03/2020 for the symptoms described in the history of present illness. He was evaluated in the context of the global COVID-19 pandemic, which necessitated consideration that the patient might be at risk for infection with the SARS-CoV-2 virus that causes COVID-19. Institutional protocols and algorithms that pertain to the evaluation of patients at risk for COVID-19 are in a state of rapid change based on information released by regulatory bodies including the CDC and federal and state organizations. These policies and algorithms were followed during the patient's care in the ED.  Some ED  evaluations and interventions may be delayed as a result of limited staffing during and after the pandemic.*  Note:  This document was prepared using Dragon voice recognition software and may include unintentional dictation errors.   Loleta Rose, MD 06/03/20 [phone]

## 2020-06-03 NOTE — Progress Notes (Signed)
  PROGRESS NOTE    Scott [name]  PYP:[id number] DOB: 1971-04-02 DOA: 06/03/2020  PCP: [facility]    LOS - 0   Same day of admission rounding note.  Patient admitted earlier this morning with Covid-19.    49 y.o. Caucasian male with a known history of coronary artery disease, type diabetes mellitus, GERD, dyslipidemia and hypertension, presented to the on 06/03/20 with upper respiratory symptoms and a having tested positive for Covid-19.  He had also had high fevers, mostly dry cough, dyspnea and intermittent wheezing.  On arrival, Temp 103.2 F, HR 127, RR 26, maintaining O2 sats on room air.  CXR with hazy opacity of retrocardiac region of possibly infectious etiology.  Admitted for further management. On remdesivir, Solu-medrol, baricitinib, Iand supportive care.     Interval subjective: Patient feels terrible.  Having headaches and coughing spells.  No fever chills.    Exam: Resting on stretcher with eyes closed but awake, no acute distress On room air, no conversational dyspnea or accessory muscle use, diminished bilaterally Regular rate and rhythm No peripheral edema   Active Problems:   Acute hypoxemic respiratory failure due to COVID-19 [facility])    I have reviewed the full H&P by Dr. Arville Care in detail, and I agree with the assessment and plan as outlined therein. In addition: --Increase Levemir to 15 units twice daily --Add NovoLog 4 units 3 times daily scheduled   No Charge    Pennie Banter, DO [facility]   If 7PM-7AM, please contact night-coverage [email or web address] 06/03/2020, 7:28 AM

## 2020-06-03 NOTE — Progress Notes (Signed)
Remdesivir - Pharmacy Brief Note   A/P:  Remdesivir 200 mg IVPB once followed by 100 mg IVPB daily x 4 days.   Valrie Hart, PharmD Clinical Pharmacist   06/03/2020 1:14 AM

## 2020-06-04 DIAGNOSIS — J9601 Acute respiratory failure with hypoxia: Secondary | ICD-10-CM | POA: Diagnosis not present

## 2020-06-04 DIAGNOSIS — U071 COVID-19: Secondary | ICD-10-CM | POA: Diagnosis not present

## 2020-06-04 LAB — GLUCOSE, CAPILLARY
Glucose-Capillary: 165 mg/dL — ABNORMAL HIGH (ref 70–99)
Glucose-Capillary: 275 mg/dL — ABNORMAL HIGH (ref 70–99)
Glucose-Capillary: 223 mg/dL — ABNORMAL HIGH (ref 70–99)
Glucose-Capillary: 192 mg/dL — ABNORMAL HIGH (ref 70–99)
Glucose-Capillary: 217 mg/dL — ABNORMAL HIGH (ref 70–99)
Glucose-Capillary: 222 mg/dL — ABNORMAL HIGH (ref 70–99)

## 2020-06-04 LAB — CBC WITH DIFFERENTIAL/PLATELET
Abs Immature Granulocytes: 0.03 10*3/uL (ref 0.00–0.07)
Basophils Absolute: 0 10*3/uL (ref 0.0–0.1)
Basophils Relative: 0 %
Eosinophils Absolute: 0 10*3/uL (ref 0.0–0.5)
Eosinophils Relative: 0 %
HCT: 44.2 % (ref 39.0–52.0)
Hemoglobin: 15.6 g/dL (ref 13.0–17.0)
Immature Granulocytes: 1 %
Lymphocytes Relative: 20 %
Lymphs Abs: 1 10*3/uL (ref 0.7–4.0)
MCH: 29.5 pg (ref 26.0–34.0)
MCHC: 35.3 g/dL (ref 30.0–36.0)
MCV: 83.6 fL (ref 80.0–100.0)
Monocytes Absolute: 0.3 10*3/uL (ref 0.1–1.0)
Monocytes Relative: 6 %
Neutro Abs: 3.7 10*3/uL (ref 1.7–7.7)
Neutrophils Relative %: 73 %
Platelets: 152 10*3/uL (ref 150–400)
RBC: 5.29 MIL/uL (ref 4.22–5.81)
RDW: 12.2 % (ref 11.5–15.5)
Smear Review: NORMAL
WBC: 5.1 10*3/uL (ref 4.0–10.5)
nRBC: 0 % (ref 0.0–0.2)

## 2020-06-04 LAB — COMPREHENSIVE METABOLIC PANEL
ALT: 31 U/L (ref 0–44)
AST: 26 U/L (ref 15–41)
Albumin: 3 g/dL — ABNORMAL LOW (ref 3.5–5.0)
Alkaline Phosphatase: 70 U/L (ref 38–126)
Anion gap: 10 (ref 5–15)
BUN: 21 mg/dL — ABNORMAL HIGH (ref 6–20)
CO2: 24 mmol/L (ref 22–32)
Calcium: 8.2 mg/dL — ABNORMAL LOW (ref 8.9–10.3)
Chloride: 103 mmol/L (ref 98–111)
Creatinine, Ser: 0.74 mg/dL (ref 0.61–1.24)
GFR, Estimated: >60 mL/min (ref >60)
Glucose, Bld: 332 mg/dL — ABNORMAL HIGH (ref 70–99)
Potassium: 4.2 mmol/L (ref 3.5–5.1)
Sodium: 137 mmol/L (ref 135–145)
Total Bilirubin: 0.7 mg/dL (ref 0.3–1.2)
Total Protein: 6.5 g/dL (ref 6.5–8.1)

## 2020-06-04 LAB — FERRITIN: Ferritin: 1115 ng/mL — ABNORMAL HIGH (ref 24–336)

## 2020-06-04 LAB — FIBRIN DERIVATIVES D-DIMER (ARMC ONLY): Fibrin derivatives D-dimer (ARMC): 486.45 ng/mL (FEU) (ref 0.00–499.00)

## 2020-06-04 LAB — C-REACTIVE PROTEIN: CRP: 4.8 mg/dL — ABNORMAL HIGH (ref <1.0)

## 2020-06-04 MED ORDER — ALBUTEROL SULFATE HFA 108 (90 BASE) MCG/ACT IN AERS
2.0000 | INHALATION_SPRAY | Freq: Four times a day (QID) | RESPIRATORY_TRACT | Status: DC
  Filled 2020-06-04: qty 6.7

## 2020-06-04 MED ORDER — ALBUTEROL SULFATE HFA 108 (90 BASE) MCG/ACT IN AERS
2.0000 | INHALATION_SPRAY | Freq: Four times a day (QID) | RESPIRATORY_TRACT | Status: DC
  Administered 2020-06-04 – 2020-06-06 (×8): 2 via RESPIRATORY_TRACT
  Filled 2020-06-04 (×2): qty 6.7

## 2020-06-04 NOTE — Progress Notes (Signed)
Inpatient Diabetes Program Recommendations  AACE/ADA: New Consensus Statement on Inpatient Glycemic Control (2015)  Target Ranges:  Prepandial:   less than 140 mg/dL      Peak postprandial:   less than 180 mg/dL (1-2 hours)      Critically ill patients:  140 - 180 mg/dL   Lab Results  Component Value Date   GLUCAP 275 (H) 06/04/2020   HGBA1C 8.8 (H) 06/03/2020    Review of Glycemic Control Diabetes history: DM 2 Outpatient Diabetes medications:  Levemir 15 units q HS Current orders for Inpatient glycemic control:  Novolog resistant tid with meals and HS Levemir 15 units bid Solumedrol 100 mg q 12 hours  Inpatient Diabetes Program Recommendations:   Consider increasing Levemir 20 units bid.  Also consider increasing Novolog meal coverage to 6 units tid with meals (hold if patient eats less than 50% or NPO).  Beryl Meager, RN, BC-ADM Inpatient Diabetes Coordinator Pager [phone] (8a-5p)

## 2020-06-04 NOTE — Progress Notes (Signed)
PROGRESS NOTE    Scott [name]  PJA:[id number] DOB: 06/01/1971 DOA: 06/03/2020 [facility]: [facility]    Assessment & Plan:   Active Problems:   Acute hypoxemic respiratory failure due to COVID-19 [facility])   Scott [name]  is a 49 y.o. Caucasian male with a known history of coronary artery disease, type diabetes mellitus, GERD, dyslipidemia and hypertension, presented to the emergency room with a concern of URI symptoms that started initially with sneezing and nasal congestion with rhinorrhea for which she get COVID-19 PCR test that came back positive as his wife also tested positive prior to that.  He later had body aches and headaches and high fever with a temperature of 102.7.  He had nausea and vomiting last night.  Since Thursday he has been having cough mainly dry and occasionally productive of clear sputum with associated dyspnea and occasional wheezing.    1. Acute hypoxemic respiratory failure secondary to COVID-19. -In the ED, dropped to 89% on room air. --Continue supplemental O2 to keep sats >=92%, wean as tolerated  Sepsis ruled out  2. Multifocal pneumonia secondary to COVID-19.   -procal neg PLAN: --cont Remdesivir --cont solumedrol --Monitor and ensure CRP down-trending --albuterol QID --Mucinex BID --IS and flutter valve --No need for Baricitinib   3.  Dyslipidemia. -cont home statin  4.  Type 2 diabetes mellitus. -cont Levemir 15u BID --cont mealtime 4u TID --SSI TID  5.  Essential hypertension. -cont home Toprol   DVT prophylaxis: Lovenox SQ Code Status: Full code  Family Communication:  Status is: inpatient Dispo:   The patient is from: home Anticipated d/c is to: home Anticipated d/c date is: 1-2 days Patient currently is not medically stable to d/c due to: covid, new O2 requirement, on treatment   Subjective and Interval History:  Reported cough.  Dyspnea improved with O2.  Normal oral  intake.   Objective: Vitals:   06/05/20 0622 06/05/20 0828 06/05/20 1233 06/05/20 1548  BP: (!) 133/92 140/87 131/83 (!) 131/94  Pulse: (!) 55 65 77 72  Resp: 18 16 16 16   Temp: (!) 97.4 F (36.3 C) (!) 97.5 F (36.4 C) (!) 97.4 F (36.3 C) 97.7 F (36.5 C)  TempSrc: Oral  Oral Oral  SpO2: 96% 91% 94% 94%  Weight:      Height:       No intake or output data in the 24 hours ending 06/05/20 1746 Filed Weights   06/03/20 0001  Weight: 99.8 kg    Examination:   Constitutional: NAD, AAOx3 HEENT: conjunctivae and lids normal, EOMI CV: No cyanosis.   RESP: diffuse rhonchi, on 2L Extremities: No effusions, edema in BLE SKIN: warm, dry and intact Neuro: II - XII grossly intact.   Psych: subdued mood and affect.  Appropriate judgement and reason    Data Reviewed: I have personally reviewed following labs and imaging studies  CBC: Recent Labs  Lab 06/03/20 0008 06/04/20 0434 06/05/20 0537  WBC 6.0 5.1 8.1  NEUTROABS  --  3.7  --   HGB 17.7* 15.6 15.8  HCT 51.4 44.2 45.2  MCV 83.8 83.6 84.3  PLT 149* 152 200   Basic Metabolic Panel: Recent Labs  Lab 06/03/20 0008 06/04/20 0434 06/05/20 0537  NA 139 137 142  K 4.1 4.2 4.4  CL 100 103 103  CO2 26 24 29   GLUCOSE 233* 332* 194*  BUN 15 21* 21*  CREATININE 1.03 0.74 0.68  CALCIUM 8.8* 8.2* 8.6*  MG  --   --  2.6*   GFR: Estimated Creatinine Clearance: 135.1 mL/min (by C-G formula based on SCr of 0.68 mg/dL). Liver Function Tests: Recent Labs  Lab 06/03/20 0008 06/04/20 0434  AST 48* 26  ALT 43 31  ALKPHOS 87 70  BILITOT 1.0 0.7  PROT 7.9 6.5  ALBUMIN 3.8 3.0*   No results for input(s): LIPASE, AMYLASE in the last 168 hours. No results for input(s): AMMONIA in the last 168 hours. Coagulation Profile: No results for input(s): INR, PROTIME in the last 168 hours. Cardiac Enzymes: No results for input(s): CKTOTAL, CKMB, CKMBINDEX, TROPONINI in the last 168 hours. BNP (last 3 results) No results for  input(s): PROBNP in the last 8760 hours. HbA1C: Recent Labs    06/03/20 0008  HGBA1C 8.8*   CBG: Recent Labs  Lab 06/04/20 2047 06/04/20 2203 06/05/20 0825 06/05/20 1234 06/05/20 1549  GLUCAP 217* 222* 192* 264* 260*   Lipid Profile: Recent Labs    06/03/20 0030  TRIG 133   Thyroid Function Tests: No results for input(s): TSH, T4TOTAL, FREET4, T3FREE, THYROIDAB in the last 72 hours. Anemia Panel: Recent Labs    06/03/20 0030 06/04/20 0434  FERRITIN 1,082* 1,115*   Sepsis Labs: Recent Labs  Lab 06/03/20 0008 06/03/20 0030 06/03/20 0408  PROCALCITON  --  <0.10  --   LATICACIDVEN 1.9  --  1.1    Recent Results (from the past 240 hour(s))  Blood Culture (routine x 2)     Status: None (Preliminary result)   Collection Time: 06/03/20 12:09 AM   Specimen: BLOOD  Result Value Ref Range Status   Specimen Description BLOOD RIGHT ANTECUBITAL  Final   Special Requests   Final    BOTTLES DRAWN AEROBIC AND ANAEROBIC Blood Culture adequate volume   Culture   Final    NO GROWTH 2 DAYS Performed at [facility], [address], [place], [place] [address]    Report Status PENDING  Incomplete  Blood Culture (routine x 2)     Status: None (Preliminary result)   Collection Time: 06/03/20 12:30 AM   Specimen: BLOOD  Result Value Ref Range Status   Specimen Description BLOOD LEFT FA  Final   Special Requests   Final    BOTTLES DRAWN AEROBIC AND ANAEROBIC Blood Culture results may not be optimal due to an inadequate volume of blood received in culture bottles   Culture   Final    NO GROWTH 2 DAYS Performed at [facility], [address] [place], [place] [address]    Report Status PENDING  Incomplete      Radiology Studies: No results found.   Scheduled Meds: . albuterol  2 puff Inhalation QID  . vitamin C  500 mg Oral Daily  . cholecalciferol  1,000 Units Oral Daily  . clopidogrel  75 mg Oral Daily  . dexamethasone  6 mg Oral Daily  .  enoxaparin (LOVENOX) injection  0.5 mg/kg Subcutaneous Q24H  . famotidine  20 mg Oral BID  . gabapentin  300 mg Oral TID  . guaiFENesin  600 mg Oral BID  . insulin aspart  0-20 Units Subcutaneous TID WC  . insulin aspart  0-5 Units Subcutaneous QHS  . insulin aspart  4 Units Subcutaneous TID WC  . insulin detemir  15 Units Subcutaneous BID  . metoprolol succinate  25 mg Oral Daily  . pravastatin  10 mg Oral q1800  . zinc sulfate  220 mg Oral Daily   Continuous Infusions: . remdesivir 100  mg in NS 100 mL 100 mg (06/05/20 0835)     LOS: 2 days     Darlin Priestly, MD [facility] If 7PM-7AM, please contact night-coverage 06/05/2020, 5:46 PM

## 2020-06-05 DIAGNOSIS — J9601 Acute respiratory failure with hypoxia: Secondary | ICD-10-CM | POA: Diagnosis not present

## 2020-06-05 DIAGNOSIS — U071 COVID-19: Secondary | ICD-10-CM | POA: Diagnosis not present

## 2020-06-05 LAB — CBC
HCT: 45.2 % (ref 39.0–52.0)
Hemoglobin: 15.8 g/dL (ref 13.0–17.0)
MCH: 29.5 pg (ref 26.0–34.0)
MCHC: 35 g/dL (ref 30.0–36.0)
MCV: 84.3 fL (ref 80.0–100.0)
Platelets: 200 10*3/uL (ref 150–400)
RBC: 5.36 MIL/uL (ref 4.22–5.81)
RDW: 12.2 % (ref 11.5–15.5)
WBC: 8.1 10*3/uL (ref 4.0–10.5)
nRBC: 0 % (ref 0.0–0.2)

## 2020-06-05 LAB — GLUCOSE, CAPILLARY
Glucose-Capillary: 192 mg/dL — ABNORMAL HIGH (ref 70–99)
Glucose-Capillary: 264 mg/dL — ABNORMAL HIGH (ref 70–99)
Glucose-Capillary: 260 mg/dL — ABNORMAL HIGH (ref 70–99)
Glucose-Capillary: 237 mg/dL — ABNORMAL HIGH (ref 70–99)

## 2020-06-05 LAB — BASIC METABOLIC PANEL
Anion gap: 10 (ref 5–15)
BUN: 21 mg/dL — ABNORMAL HIGH (ref 6–20)
CO2: 29 mmol/L (ref 22–32)
Calcium: 8.6 mg/dL — ABNORMAL LOW (ref 8.9–10.3)
Chloride: 103 mmol/L (ref 98–111)
Creatinine, Ser: 0.68 mg/dL (ref 0.61–1.24)
GFR, Estimated: >60 mL/min (ref >60)
Glucose, Bld: 194 mg/dL — ABNORMAL HIGH (ref 70–99)
Potassium: 4.4 mmol/L (ref 3.5–5.1)
Sodium: 142 mmol/L (ref 135–145)

## 2020-06-05 LAB — MAGNESIUM: Magnesium: 2.6 mg/dL — ABNORMAL HIGH (ref 1.7–2.4)

## 2020-06-05 LAB — C-REACTIVE PROTEIN: CRP: 1.5 mg/dL — ABNORMAL HIGH (ref <1.0)

## 2020-06-05 MED ORDER — DEXAMETHASONE 4 MG PO TABS
6.0000 mg | ORAL_TABLET | Freq: Every day | ORAL | Status: DC
  Administered 2020-06-05 – 2020-06-06 (×2): 6 mg via ORAL
  Filled 2020-06-05 (×2): qty 2

## 2020-06-05 NOTE — Progress Notes (Signed)
SATURATION QUALIFICATIONS: (This note is used to comply with regulatory documentation for home oxygen)  Patient Saturations on Room Air at Rest = 90%  Patient Saturations on Room Air while Ambulating = 84%  Patient Saturations on 3 Liters of oxygen while Ambulating = 94%  Please briefly explain why patient needs home oxygen:  covid pneumonia

## 2020-06-05 NOTE — Progress Notes (Signed)
PROGRESS NOTE    Scott [name]  IZT:[id number] DOB: 01-15-1971 DOA: 06/03/2020 PCP: [facility]    Assessment & Plan:   Active Problems:   Acute hypoxemic respiratory failure due to COVID-19 [facility])   Keldan Eplin  is a 49 y.o. Caucasian male with a known history of coronary artery disease, type diabetes mellitus, GERD, dyslipidemia and hypertension, presented to the emergency room with a concern of URI symptoms that started initially with sneezing and nasal congestion with rhinorrhea for which she get COVID-19 PCR test that came back positive as his wife also tested positive prior to that.  He later had body aches and headaches and high fever with a temperature of 102.7.  He had nausea and vomiting last night.  Since Thursday he has been having cough mainly dry and occasionally productive of clear sputum with associated dyspnea and occasional wheezing.    1. Acute hypoxemic respiratory failure secondary to COVID-19. -In the ED, dropped to 89% on room air. --Continue supplemental O2 to keep sats >=92%, wean as tolerated  Sepsis ruled out  2. Multifocal pneumonia secondary to COVID-19.   -procal neg PLAN: --cont Remdesivir --taper from solumedrol to oral decadron --Monitor and ensure CRP down-trending --albuterol QID --Mucinex BID --IS and flutter valve --No need for Baricitinib   3.  Dyslipidemia. --cont home statin  4.  Type 2 diabetes mellitus. -cont Levemir 15u BID --cont mealtime 4u TID --SSI TID  5.  Essential hypertension. -cont home Toprol   DVT prophylaxis: Lovenox SQ Code Status: Full code  Family Communication:  Status is: inpatient Dispo:   The patient is from: home Anticipated d/c is to: home Anticipated d/c date is: 1-2 days Patient currently is not medically stable to d/c due to: covid, new O2 requirement, on treatment   Subjective and Interval History:  Continued to have cough with sputum production.  No N/V/D.   Normal oral intake.  Stable O2 requirement at 2L.   Objective: Vitals:   06/05/20 2037 06/06/20 0022 06/06/20 0449 06/06/20 0818  BP: 131/84 121/79 139/86 (!) 143/87  Pulse: 64 69 (!) 58 (!) 51  Resp: 19 19 19 18   Temp: 98.1 F (36.7 C) 97.8 F (36.6 C) (!) 97.5 F (36.4 C) (!) 97.4 F (36.3 C)  TempSrc: Oral Oral Oral   SpO2: 94% 96% 93% 94%  Weight:      Height:       No intake or output data in the 24 hours ending 06/13/20 2236 Filed Weights   06/03/20 0001  Weight: 99.8 kg    Examination:   Constitutional: NAD, AAOx3 HEENT: conjunctivae and lids normal, EOMI CV: No cyanosis.   RESP: no distress, on 2L Extremities: No effusions, edema in BLE SKIN: warm, dry and intact Neuro: II - XII grossly intact.   Psych: Normal mood and affect.  Appropriate judgement and reason   Data Reviewed: I have personally reviewed following labs and imaging studies  CBC: No results for input(s): WBC, NEUTROABS, HGB, HCT, MCV, PLT in the last 168 hours. Basic Metabolic Panel: No results for input(s): NA, K, CL, CO2, GLUCOSE, BUN, CREATININE, CALCIUM, MG, PHOS in the last 168 hours. GFR: Estimated Creatinine Clearance: 135.1 mL/min (by C-G formula based on SCr of 0.63 mg/dL). Liver Function Tests: No results for input(s): AST, ALT, ALKPHOS, BILITOT, PROT, ALBUMIN in the last 168 hours. No results for input(s): LIPASE, AMYLASE in the last 168 hours. No results for input(s): AMMONIA in the last 168 hours. Coagulation  Profile: No results for input(s): INR, PROTIME in the last 168 hours. Cardiac Enzymes: No results for input(s): CKTOTAL, CKMB, CKMBINDEX, TROPONINI in the last 168 hours. BNP (last 3 results) No results for input(s): PROBNP in the last 8760 hours. HbA1C: No results for input(s): HGBA1C in the last 72 hours. CBG: No results for input(s): GLUCAP in the last 168 hours. Lipid Profile: No results for input(s): CHOL, HDL, LDLCALC, TRIG, CHOLHDL, LDLDIRECT in the last 72  hours. Thyroid Function Tests: No results for input(s): TSH, T4TOTAL, FREET4, T3FREE, THYROIDAB in the last 72 hours. Anemia Panel: No results for input(s): VITAMINB12, FOLATE, FERRITIN, TIBC, IRON, RETICCTPCT in the last 72 hours. Sepsis Labs: No results for input(s): PROCALCITON, LATICACIDVEN in the last 168 hours.  No results found for this or any previous visit (from the past 240 hour(s)).    Radiology Studies: No results found.   Scheduled Meds:  Continuous Infusions:    LOS: 3 days     Darlin Priestly, MD [facility] If 7PM-7AM, please contact night-coverage 06/13/2020, 10:36 PM

## 2020-06-06 DIAGNOSIS — U071 COVID-19: Secondary | ICD-10-CM | POA: Diagnosis not present

## 2020-06-06 DIAGNOSIS — J9601 Acute respiratory failure with hypoxia: Secondary | ICD-10-CM | POA: Diagnosis not present

## 2020-06-06 LAB — BASIC METABOLIC PANEL
Anion gap: 9 (ref 5–15)
BUN: 22 mg/dL — ABNORMAL HIGH (ref 6–20)
CO2: 26 mmol/L (ref 22–32)
Calcium: 8.6 mg/dL — ABNORMAL LOW (ref 8.9–10.3)
Chloride: 103 mmol/L (ref 98–111)
Creatinine, Ser: 0.63 mg/dL (ref 0.61–1.24)
GFR, Estimated: >60 mL/min (ref >60)
Glucose, Bld: 266 mg/dL — ABNORMAL HIGH (ref 70–99)
Potassium: 3.9 mmol/L (ref 3.5–5.1)
Sodium: 138 mmol/L (ref 135–145)

## 2020-06-06 LAB — CBC
HCT: 42.2 % (ref 39.0–52.0)
Hemoglobin: 14.8 g/dL (ref 13.0–17.0)
MCH: 29.2 pg (ref 26.0–34.0)
MCHC: 35.1 g/dL (ref 30.0–36.0)
MCV: 83.2 fL (ref 80.0–100.0)
Platelets: 198 10*3/uL (ref 150–400)
RBC: 5.07 MIL/uL (ref 4.22–5.81)
RDW: 12.1 % (ref 11.5–15.5)
WBC: 8 10*3/uL (ref 4.0–10.5)
nRBC: 0 % (ref 0.0–0.2)

## 2020-06-06 LAB — GLUCOSE, CAPILLARY
Glucose-Capillary: 205 mg/dL — ABNORMAL HIGH (ref 70–99)
Glucose-Capillary: 204 mg/dL — ABNORMAL HIGH (ref 70–99)

## 2020-06-06 LAB — MAGNESIUM: Magnesium: 2.5 mg/dL — ABNORMAL HIGH (ref 1.7–2.4)

## 2020-06-06 LAB — C-REACTIVE PROTEIN: CRP: 1.1 mg/dL — ABNORMAL HIGH (ref <1.0)

## 2020-06-06 MED ORDER — ALBUTEROL SULFATE HFA 108 (90 BASE) MCG/ACT IN AERS: 2.0000 | INHALATION_SPRAY | Freq: Four times a day (QID) | RESPIRATORY_TRACT | Status: AC

## 2020-06-06 MED ORDER — METOPROLOL SUCCINATE ER 25 MG PO TB24: 25.0000 mg | ORAL_TABLET | Freq: Every day | ORAL | 2 refills | Status: DC

## 2020-06-06 MED ORDER — HYDROCOD POLST-CPM POLST ER 10-8 MG/5ML PO SUER: 5.0000 mL | Freq: Two times a day (BID) | ORAL | 0 refills | Status: DC | PRN

## 2020-06-06 MED ORDER — GUAIFENESIN-DM 100-10 MG/5ML PO SYRP
10.0000 mL | ORAL_SOLUTION | ORAL | 0 refills | Status: AC | PRN
Start: 2020-06-06 — End: ?

## 2020-06-06 MED ORDER — CLOPIDOGREL BISULFATE 75 MG PO TABS: ORAL_TABLET | ORAL | 0 refills | Status: AC

## 2020-06-06 MED ORDER — DEXAMETHASONE 6 MG PO TABS: 6.0000 mg | ORAL_TABLET | Freq: Every day | ORAL | 0 refills | Status: DC

## 2020-06-06 MED ORDER — LISINOPRIL 20 MG PO TABS
20.0000 mg | ORAL_TABLET | Freq: Every day | ORAL | 2 refills | Status: DC
Start: 2020-06-06 — End: 2020-06-07

## 2020-06-06 NOTE — Progress Notes (Signed)
SATURATION QUALIFICATIONS: (This note is used to comply with regulatory documentation for home oxygen)  Patient Saturations on Room Air at Rest = 95%  Patient Saturations on Room Air while Ambulating = 91%  Patient Saturations on 0 Liters of oxygen while Ambulating = 91%  Please briefly explain why patient needs home oxygen: 

## 2020-06-06 NOTE — Progress Notes (Signed)
Pt to notify me when his wife arrives for discharge within the next 15 minutes per his report

## 2020-06-06 NOTE — Plan of Care (Signed)
  Problem: Education: Goal: Knowledge of risk factors and measures for prevention of condition will improve Outcome: Adequate for Discharge   

## 2020-06-06 NOTE — Discharge Summary (Signed)
Physician Discharge Summary   Scott [name]  male DOB: Jan 11, 1971  FGH:[id number]  PCP: [facility]  Admit date: 06/03/2020 Discharge date: 06/06/2020  Admitted From: home Disposition:  Home.   Tried to call wife on pt's phone prior to discharge, but didn't reach her.  CODE STATUS: Full code  Discharge Instructions    Discharge instructions   Complete by: As directed    You have COVID infection, but improving, and now do not need extra oxygen with walking, so you can go home to recover.  Please take 5 more days of steroid Decadron to help reduce inflammation.   Dr. Darlin Priestly [facility] Course:  For full details, please see H&P, progress notes, consult notes and ancillary notes.  Briefly,  MatthewWhitlowis a49 y.o.Caucasian malewith a known history of coronary artery disease, type diabetes mellitus, GERD, dyslipidemia and hypertension, presented to the emergency room with a concern of URI symptoms that started initially withsneezing and nasal congestion with rhinorrhea for which he had COVID-19 PCR test that came back positive as his wife also tested positive prior to that.    1. Acute hypoxemic respiratory failure secondary to COVID-19. In the ED, dropped to 89% on room air, and pt was put on 2L.  Pt's symptoms and breathing improved quickly and pt wanted to go home.  Prior to discharge, pt was sating 91% on room air while ambulating.   Sepsis ruled out  2. Multifocal pneumoniasecondary to COVID-19. Procal neg.  CXR showed "Hazy airspace opacity in the retrocardiac region."  Pt was started on Remdesivir and solumedrol.  CRP was 6.2 on presentation and trended down to 1.1 on the day of discharge.  Pt was discharged on 5 more days of oral Decadron.  3. Dyslipidemia. Continued home statin  4. Type 2 diabetes mellitus, poorly controlled.   Hyperglycemia exacerbated by steroid use A1c 8.8.  Pt received Levemir 15u  BID, mealtime 4u TID and SSI during hospitalization.  Pt was discharged on home diabetic regimen.  5. Essential hypertension. Continued home Toprol   Discharge Diagnoses:  Active Problems:   Acute hypoxemic respiratory failure due to COVID-19 [facility])    Discharge Instructions:  Allergies as of 06/06/2020      Reactions   Biaxin  [clarithromycin]    CAUSE RASH   Penicillins    CAUSE RASH   Prednisone    FEEL DIZZY      Medication List    TAKE these medications   albuterol 108 (90 Base) MCG/ACT inhaler Commonly known as: VENTOLIN HFA Inhale 2 puffs into the lungs 4 (four) times daily for 7 days.   aspirin EC 81 MG tablet Take 1 tablet (81 mg total) by mouth daily.   chlorpheniramine-HYDROcodone 10-8 MG/5ML Suer Commonly known as: TUSSIONEX Take 5 mLs by mouth every 12 (twelve) hours as needed for up to 7 days for cough.   clopidogrel 75 MG tablet Commonly known as: PLAVIX Check with your outpatient doctor to see if you still need this med. What changed:   how much to take  how to take this  when to take this  additional instructions   dexamethasone 6 MG tablet Commonly known as: DECADRON Take 1 tablet (6 mg total) by mouth daily for 5 days. Steroid to reduce inflammation. Start taking on: June 07, 2020   gabapentin 300 MG capsule Commonly known as: NEURONTIN Take 1 capsule (300 mg total) by mouth 3 (three) times daily.  guaiFENesin-dextromethorphan 100-10 MG/5ML syrup Commonly known as: ROBITUSSIN DM Take 10 mLs by mouth every 4 (four) hours as needed for cough.   insulin detemir 100 UNIT/ML injection Commonly known as: LEVEMIR Inject 0.15 mLs (15 Units total) into the skin at bedtime.   lisinopril 20 MG tablet Commonly known as: ZESTRIL Take 1 tablet (20 mg total) by mouth daily.   metoprolol succinate 25 MG 24 hr tablet Commonly known as: TOPROL-XL Take 1 tablet (25 mg total) by mouth daily.   omeprazole 40 MG capsule Commonly known  as: PRILOSEC Take 40 mg by mouth daily.   pravastatin 10 MG tablet Commonly known as: PRAVACHOL Take 1 tablet (10 mg total) by mouth daily.        Follow-up Information    [facility]. Schedule an appointment as soon as possible for a visit in 1 week(s).   Specialty: General Practice Contact information: [address] [place] [address] [phone]               Allergies  Allergen Reactions  . Biaxin  [Clarithromycin]     CAUSE RASH  . Penicillins     CAUSE RASH  . Prednisone     FEEL DIZZY     The results of significant diagnostics from this hospitalization (including imaging, microbiology, ancillary and laboratory) are listed below for reference.   Consultations:   Procedures/Studies: DG Chest Portable 1 View  Result Date: 06/03/2020 CLINICAL DATA:  Shortness of breath EXAM: PORTABLE CHEST 1 VIEW COMPARISON:  None. FINDINGS: The heart size and mediastinal contours are within normal limits. Overall shallow degree of aeration. There is hazy patchy airspace opacity seen within the left retrocardiac region. The visualized skeletal structures are unremarkable. IMPRESSION: Hazy airspace opacity in the retrocardiac region which could be due to atelectasis and/or infectious etiology. Electronically Signed   By: Jonna Clark M.D.   On: 06/03/2020 00:35      Labs: BNP (last 3 results) Recent Labs    06/03/20 0408  BNP 163.8*   Basic Metabolic Panel: Recent Labs  Lab 06/03/20 0008 06/04/20 0434 06/05/20 0537 06/06/20 0517  NA 139 137 142 138  K 4.1 4.2 4.4 3.9  CL 100 103 103 103  CO2 26 24 29 26   GLUCOSE 233* 332* 194* 266*  BUN 15 21* 21* 22*  CREATININE 1.03 0.74 0.68 0.63  CALCIUM 8.8* 8.2* 8.6* 8.6*  MG  --   --  2.6* 2.5*   Liver Function Tests: Recent Labs  Lab 06/03/20 0008 06/04/20 0434  AST 48* 26  ALT 43 31  ALKPHOS 87 70  BILITOT 1.0 0.7  PROT 7.9 6.5  ALBUMIN 3.8 3.0*   No results  for input(s): LIPASE, AMYLASE in the last 168 hours. No results for input(s): AMMONIA in the last 168 hours. CBC: Recent Labs  Lab 06/03/20 0008 06/04/20 0434 06/05/20 0537 06/06/20 0517  WBC 6.0 5.1 8.1 8.0  NEUTROABS  --  3.7  --   --   HGB 17.7* 15.6 15.8 14.8  HCT 51.4 44.2 45.2 42.2  MCV 83.8 83.6 84.3 83.2  PLT 149* 152 200 198   Cardiac Enzymes: No results for input(s): CKTOTAL, CKMB, CKMBINDEX, TROPONINI in the last 168 hours. BNP: Invalid input(s): POCBNP CBG: Recent Labs  Lab 06/05/20 1234 06/05/20 1549 06/05/20 2035 06/06/20 0820 06/06/20 1200  GLUCAP 264* 260* 237* 205* 204*   D-Dimer No results for input(s): DDIMER in the last 72 hours. Hgb A1c  No results for input(s): HGBA1C in the last 72 hours. Lipid Profile No results for input(s): CHOL, HDL, LDLCALC, TRIG, CHOLHDL, LDLDIRECT in the last 72 hours. Thyroid function studies No results for input(s): TSH, T4TOTAL, T3FREE, THYROIDAB in the last 72 hours.  Invalid input(s): FREET3 Anemia work up Recent Labs    06/04/20 0434  FERRITIN 1,115*   Urinalysis    Component Value Date/Time   COLORURINE COLORLESS (A) 08/24/2016 1921   APPEARANCEUR CLEAR (A) 08/24/2016 1921   APPEARANCEUR Clear 05/09/2012 1531   LABSPEC 1.001 (L) 08/24/2016 1921   LABSPEC 1.043 05/09/2012 1531   PHURINE 8.0 08/24/2016 1921   GLUCOSEU >=500 (A) 08/24/2016 1921   GLUCOSEU >=500 05/09/2012 1531   HGBUR NEGATIVE 08/24/2016 1921   BILIRUBINUR NEGATIVE 08/24/2016 1921   BILIRUBINUR negative 01/30/2015 1330   BILIRUBINUR Negative 05/09/2012 1531   KETONESUR NEGATIVE 08/24/2016 1921   PROTEINUR NEGATIVE 08/24/2016 1921   UROBILINOGEN 0.2 01/30/2015 1330   NITRITE NEGATIVE 08/24/2016 1921   LEUKOCYTESUR NEGATIVE 08/24/2016 1921   LEUKOCYTESUR Negative 05/09/2012 1531   Sepsis Labs Invalid input(s): PROCALCITONIN,  WBC,  LACTICIDVEN Microbiology Recent Results (from the past 240 hour(s))  Blood Culture (routine x 2)      Status: None (Preliminary result)   Collection Time: 06/03/20 12:09 AM   Specimen: BLOOD  Result Value Ref Range Status   Specimen Description BLOOD RIGHT ANTECUBITAL  Final   Special Requests   Final    BOTTLES DRAWN AEROBIC AND ANAEROBIC Blood Culture adequate volume   Culture   Final    NO GROWTH 3 DAYS Performed at [facility], [address], [place], [place] [address]    Report Status PENDING  Incomplete  Blood Culture (routine x 2)     Status: None (Preliminary result)   Collection Time: 06/03/20 12:30 AM   Specimen: BLOOD  Result Value Ref Range Status   Specimen Description BLOOD LEFT FA  Final   Special Requests   Final    BOTTLES DRAWN AEROBIC AND ANAEROBIC Blood Culture results may not be optimal due to an inadequate volume of blood received in culture bottles   Culture   Final    NO GROWTH 3 DAYS Performed at [facility], [address] [place], [place] [address]    Report Status PENDING  Incomplete     Total time spend on discharging this patient, including the last patient exam, discussing the hospital stay, instructions for ongoing care as it relates to all pertinent caregivers, as well as preparing the medical discharge records, prescriptions, and/or referrals as applicable, is 40 minutes.    Darlin Priestly, MD  [facility] 06/06/2020, 12:35 PM

## 2020-06-07 ENCOUNTER — Other Ambulatory Visit: Payer: Self-pay | Admitting: Hospitalist

## 2020-06-07 MED ORDER — DEXAMETHASONE 6 MG PO TABS: 6.0000 mg | ORAL_TABLET | Freq: Every day | ORAL | 0 refills | Status: AC

## 2020-06-07 MED ORDER — LISINOPRIL 20 MG PO TABS: 20.0000 mg | ORAL_TABLET | Freq: Every day | ORAL | 2 refills | Status: AC

## 2020-06-07 MED ORDER — HYDROCOD POLST-CPM POLST ER 10-8 MG/5ML PO SUER: 5.0000 mL | Freq: Two times a day (BID) | ORAL | 0 refills | Status: AC | PRN

## 2020-06-07 MED ORDER — METOPROLOL SUCCINATE ER 25 MG PO TB24: 25.0000 mg | ORAL_TABLET | Freq: Every day | ORAL | 2 refills | Status: AC

## 2020-06-08 LAB — CULTURE, BLOOD (ROUTINE X 2)
Culture: NO GROWTH
Culture: NO GROWTH
Special Requests: ADEQUATE

## 2022-07-25 ENCOUNTER — Other Ambulatory Visit: Payer: Self-pay

## 2022-07-25 ENCOUNTER — Emergency Department: Payer: Self-pay

## 2022-07-25 ENCOUNTER — Emergency Department
Admission: EM | Admit: 2022-07-25 | Discharge: 2022-07-25 | Disposition: A | Discharge status: Home / Self Care | Payer: Self-pay | Attending: Emergency Medicine | Admitting: Emergency Medicine

## 2022-07-25 DIAGNOSIS — E119 Type 2 diabetes mellitus without complications: Secondary | ICD-10-CM | POA: Insufficient documentation

## 2022-07-25 DIAGNOSIS — I10 Essential (primary) hypertension: Secondary | ICD-10-CM | POA: Insufficient documentation

## 2022-07-25 DIAGNOSIS — U071 COVID-19: Secondary | ICD-10-CM | POA: Insufficient documentation

## 2022-07-25 DIAGNOSIS — R519 Headache, unspecified: Secondary | ICD-10-CM

## 2022-07-25 DIAGNOSIS — I2510 Atherosclerotic heart disease of native coronary artery without angina pectoris: Secondary | ICD-10-CM | POA: Insufficient documentation

## 2022-07-25 LAB — BASIC METABOLIC PANEL
Anion gap: 10 (ref 5–15)
BUN: 10 mg/dL (ref 6–20)
CO2: 24 mmol/L (ref 22–32)
Calcium: 9.5 mg/dL (ref 8.9–10.3)
Chloride: 100 mmol/L (ref 98–111)
Creatinine, Ser: 0.87 mg/dL (ref 0.61–1.24)
GFR, Estimated: >60 mL/min (ref >60)
Glucose, Bld: 283 mg/dL — ABNORMAL HIGH (ref 70–99)
Potassium: 4 mmol/L (ref 3.5–5.1)
Sodium: 134 mmol/L — ABNORMAL LOW (ref 135–145)

## 2022-07-25 LAB — RESP PANEL BY RT-PCR (RSV, FLU A&B, COVID)  RVPGX2
Influenza A by PCR: NEGATIVE
Influenza B by PCR: NEGATIVE
Resp Syncytial Virus by PCR: NEGATIVE
SARS Coronavirus 2 by RT PCR: POSITIVE — AB

## 2022-07-25 LAB — CBC
HCT: 47.8 % (ref 39.0–52.0)
Hemoglobin: 16.6 g/dL (ref 13.0–17.0)
MCH: 27.5 pg (ref 26.0–34.0)
MCHC: 34.7 g/dL (ref 30.0–36.0)
MCV: 79.3 fL — ABNORMAL LOW (ref 80.0–100.0)
Platelets: 234 10*3/uL (ref 150–400)
RBC: 6.03 MIL/uL — ABNORMAL HIGH (ref 4.22–5.81)
RDW: 13 % (ref 11.5–15.5)
WBC: 7 10*3/uL (ref 4.0–10.5)
nRBC: 0 % (ref 0.0–0.2)

## 2022-07-25 LAB — TROPONIN I (HIGH SENSITIVITY)
Troponin I (High Sensitivity): 4 ng/L (ref <18)
Troponin I (High Sensitivity): 4 ng/L (ref <18)

## 2022-07-25 MED ORDER — METOPROLOL TARTRATE 25 MG PO TABS
25.0000 mg | ORAL_TABLET | Freq: Once | ORAL | Status: AC
  Administered 2022-07-25: 25 mg via ORAL
  Filled 2022-07-25: qty 1

## 2022-07-25 MED ORDER — SODIUM CHLORIDE 0.9 % IV BOLUS
1000.0000 mL | Freq: Once | INTRAVENOUS | Status: AC
  Administered 2022-07-25: 1000 mL via INTRAVENOUS

## 2022-07-25 MED ORDER — LISINOPRIL 10 MG PO TABS
10.0000 mg | ORAL_TABLET | Freq: Once | ORAL | Status: AC
  Administered 2022-07-25: 10 mg via ORAL
  Filled 2022-07-25: qty 1

## 2022-07-25 MED ORDER — KETOROLAC TROMETHAMINE 30 MG/ML IJ SOLN
30.0000 mg | Freq: Once | INTRAMUSCULAR | Status: AC
  Administered 2022-07-25: 30 mg via INTRAVENOUS
  Filled 2022-07-25: qty 1

## 2022-07-25 MED ORDER — METOCLOPRAMIDE HCL 5 MG/ML IJ SOLN
10.0000 mg | Freq: Once | INTRAMUSCULAR | Status: AC
  Administered 2022-07-25: 10 mg via INTRAVENOUS
  Filled 2022-07-25: qty 2

## 2022-07-25 MED ORDER — BUTALBITAL-APAP-CAFFEINE 50-325-40 MG PO TABS: 1.0000 | ORAL_TABLET | Freq: Four times a day (QID) | ORAL | 0 refills | Status: AC | PRN

## 2022-07-25 MED ORDER — DIPHENHYDRAMINE HCL 50 MG/ML IJ SOLN
50.0000 mg | Freq: Once | INTRAMUSCULAR | Status: AC
  Administered 2022-07-25: 50 mg via INTRAVENOUS
  Filled 2022-07-25: qty 1

## 2022-07-25 NOTE — ED Triage Notes (Signed)
Pt to ED for HTN and headache. 176/116 at home on device. Pt states he is dizzy and has pressure in his chest. Pt denies N/V/D. Pt has HX of HTN and takes meds for it but has been out for 2 weeks for his BP meds. Pt has [employer]PASS in aug 2023. Pt is crying and appears in moderate discomfort at this time.

## 2022-07-25 NOTE — ED Provider Notes (Signed)
[facility] Provider Note    Event Date/Time   First MD Initiated Contact with Patient 07/25/22 1957     (approximate)  History   Chief Complaint: Chest Pain and Hypertension  HPI  Scott [name] is a 52 y.o. male with a past medical history of CAD status post bypass, diabetes, hypertension, hyperlipidemia presents to the emergency department for headache.  According to the patient since earlier today he has developed a significant headache which he describes as a pressure sensation across the head.  Patient also states for the last 2 weeks he has been out of all of his blood pressure and cardiac medications.  He has an appointment with his doctor but is not until the eighth.  Blood pressure initially 151/99, but without intervention is now down to 628 systolic.  Patient states he has had some sinus congestion as well.  No fever.  No weakness or numbness of any arm or leg.  States that time she has felt tingling across both sides of his lips but none currently.  Physical Exam   Triage Vital Signs: ED Triage Vitals  Enc Vitals Group     BP 07/25/22 1827 (!) 151/99     Pulse Rate 07/25/22 1827 83     Resp 07/25/22 1827 20     Temp 07/25/22 1827 97.9 F (36.6 C)     Temp Source 07/25/22 1827 Oral     SpO2 07/25/22 1827 100 %     Weight 07/25/22 1823 220 lb 7.4 oz (100 kg)     Height 07/25/22 1823 5\' 10"  (1.778 m)     Head Circumference --      Peak Flow --      Pain Score 07/25/22 1822 3     Pain Loc --      Pain Edu? --      Excl. in [place]? --     Most recent vital signs: Vitals:   07/25/22 1827 07/25/22 1948  BP: (!) 151/99 (!) 152/88  Pulse: 83 74  Resp: 20 20  Temp: 97.9 F (36.6 C)   SpO2: 100% 100%    General: Awake, no distress.  CV:  Good peripheral perfusion.  Regular rate and rhythm  Resp:  Normal effort.  Equal breath sounds bilaterally.  Abd:  No distention.  Soft, nontender.  No rebound or guarding. Other:  Equal grip strength  bilaterally with no pronator drift.  5/5 motor in extremities.  No cranial nerve deficit or droop noted.   ED Results / Procedures / Treatments   EKG  EKG viewed and interpreted by myself shows a normal sinus rhythm at 93 bpm with a narrow QRS, normal axis, normal intervals, no concerning ST changes.  RADIOLOGY  I have reviewed and interpreted the chest x-ray images.  No consolidation seen on my evaluation. Radiology is read the chest x-ray is negative. CT scan is negative for acute abnormality.  There is sinus congestion.  MEDICATIONS ORDERED IN ED: Medications  sodium chloride 0.9 % bolus 1,000 mL (has no administration in time range)  ketorolac (TORADOL) 30 MG/ML injection 30 mg (has no administration in time range)  metoCLOPramide (REGLAN) injection 10 mg (has no administration in time range)  diphenhydrAMINE (BENADRYL) injection 50 mg (has no administration in time range)     IMPRESSION / MDM / ASSESSMENT AND PLAN / ED COURSE  I reviewed the triage vital signs and the nursing notes.  Patient's presentation is most consistent with acute presentation with  potential threat to life or bodily function.  Patient presents emergency department for headache that started several hours ago.  Patient states he very rarely gets headaches has not had a headache this severe for approximately 5 years per patient.  Denies any weakness or numbness.  Does states some sinus congestion recently.  No fever.  Overall patient appears well with a reassuring physical exam, reassuring neurological exam and reassuring vital signs.  We will dose the patient's home blood pressure medications, we will obtain CT imaging of the head as the patient has not had this previously per patient.  Patient's lab work including a CBC chemistry and troponin are normal.  CT scan shows no significant findings.  Patient is COVID-positive likely explaining the patient's headache also the CT does show some sinus congestion.   Patient is feeling much better after treatment.  Will discharge with a prescription for Fioricet to be used if needed.  Patient states congestion started a week ago, do not believe Paxlovid would be of benefit at this time.  FINAL CLINICAL IMPRESSION(S) / ED DIAGNOSES   Headache COVID-19  Note:  This document was prepared using Dragon voice recognition software and may include unintentional dictation errors.   Harvest Dark, MD 07/25/22 2156

## 2023-07-17 ENCOUNTER — Other Ambulatory Visit: Payer: Self-pay

## 2023-07-17 ENCOUNTER — Emergency Department
Admission: EM | Admit: 2023-07-17 | Discharge: 2023-07-17 | Disposition: A | Discharge status: Home / Self Care | Payer: Self-pay | Attending: Emergency Medicine | Admitting: Emergency Medicine

## 2023-07-17 ENCOUNTER — Emergency Department: Payer: Self-pay

## 2023-07-17 DIAGNOSIS — Z23 Encounter for immunization: Secondary | ICD-10-CM | POA: Insufficient documentation

## 2023-07-17 DIAGNOSIS — I2510 Atherosclerotic heart disease of native coronary artery without angina pectoris: Secondary | ICD-10-CM | POA: Insufficient documentation

## 2023-07-17 DIAGNOSIS — E119 Type 2 diabetes mellitus without complications: Secondary | ICD-10-CM | POA: Insufficient documentation

## 2023-07-17 DIAGNOSIS — M7989 Other specified soft tissue disorders: Secondary | ICD-10-CM | POA: Insufficient documentation

## 2023-07-17 DIAGNOSIS — S60221A Contusion of right hand, initial encounter: Secondary | ICD-10-CM

## 2023-07-17 DIAGNOSIS — I10 Essential (primary) hypertension: Secondary | ICD-10-CM | POA: Insufficient documentation

## 2023-07-17 DIAGNOSIS — W228XXA Striking against or struck by other objects, initial encounter: Secondary | ICD-10-CM | POA: Insufficient documentation

## 2023-07-17 MED ORDER — HYDROCODONE-ACETAMINOPHEN 5-325 MG PO TABS: 1.0000 | ORAL_TABLET | ORAL | 0 refills | Status: AC | PRN

## 2023-07-17 MED ORDER — TETANUS-DIPHTH-ACELL PERTUSSIS 5-2.5-18.5 LF-MCG/0.5 IM SUSY
0.5000 mL | PREFILLED_SYRINGE | Freq: Once | INTRAMUSCULAR | Status: AC
  Administered 2023-07-17: 0.5 mL via INTRAMUSCULAR
  Filled 2023-07-17: qty 0.5

## 2023-07-17 MED ORDER — CLINDAMYCIN HCL 300 MG PO CAPS: 300.0000 mg | ORAL_CAPSULE | Freq: Three times a day (TID) | ORAL | 0 refills | Status: AC

## 2023-07-17 MED ORDER — CLINDAMYCIN HCL 150 MG PO CAPS
300.0000 mg | ORAL_CAPSULE | Freq: Once | ORAL | Status: AC
  Administered 2023-07-17: 300 mg via ORAL
  Filled 2023-07-17: qty 2

## 2023-07-17 NOTE — Discharge Instructions (Addendum)
Please take antibiotic as prescribed for its entire course.  Please take your pain medication if needed but only as written.  Do not drink alcohol or drive while taking this medication.  Return to the emergency department or follow-up with your doctor if you notice any increase in swelling develop a fever or any discharge from the hand.

## 2023-07-17 NOTE — ED Provider Notes (Signed)
[facility] Provider Note    Event Date/Time   First MD Initiated Contact with Patient 07/17/23 1020     (approximate)  History   Chief Complaint: Hand Injury  HPI  Scott [name] is a 53 y.o. male with a past medical history of CAD, diabetes, gastric reflux, hypertension, hyperlipidemia, presents to the emergency department for right hand pain and swelling.  According to the patient on Friday night he states he was drinking alcohol and attempted to punch through a wooden board with his friends as a contest.  Patient states immediately after punching through the board his hand was painful and the next morning was swollen.  Patient has small abrasion to the right knuckle.  No other injuries.  Unknown last tetanus, but believes it was over a decade ago.  Physical Exam   Triage Vital Signs: ED Triage Vitals  Encounter Vitals Group     BP 07/17/23 0952 (!) 143/102     Systolic BP Percentile --      Diastolic BP Percentile --      Pulse Rate 07/17/23 0952 94     Resp 07/17/23 0952 17     Temp 07/17/23 0952 97.9 F (36.6 C)     Temp Source 07/17/23 0952 Oral     SpO2 07/17/23 0952 96 %     Weight 07/17/23 0950 220 lb 9.6 oz (100.1 kg)     Height 07/17/23 0950 5\' 10"  (1.778 m)     Head Circumference --      Peak Flow --      Pain Score 07/17/23 0950 8     Pain Loc --      Pain Education --      Exclude from Growth Chart --     Most recent vital signs: Vitals:   07/17/23 0952  BP: (!) 143/102  Pulse: 94  Resp: 17  Temp: 97.9 F (36.6 C)  SpO2: 96%    General: Awake, no distress.  CV:  Good peripheral perfusion. Resp:  Normal effort. Other:  Abrasion to the third and fourth knuckles.  Moderate swelling to the dorsal aspect of the right hand.  Moderate tenderness to palpation over the third and fourth metacarpals.   ED Results / Procedures / Treatments   RADIOLOGY  I have reviewed and interpreted the hand x-ray images.  I do not appreciate  any obvious fracture on my evaluation. Radiology has read the x-ray is soft tissue swelling with no acute fracture.  MEDICATIONS ORDERED IN ED: Medications - No data to display   IMPRESSION / MDM / ASSESSMENT AND PLAN / ED COURSE  I reviewed the triage vital signs and the nursing notes.  Patient's presentation is most consistent with acute illness / injury with system symptoms.  Patient presents to the emergency department for right hand pain after reportedly punching through wooden boards on Friday night.  Patient does have moderate swelling to the dorsal aspect of the right hand has 2 abrasions to the knuckles of the right hand.  Patient's unknown last tetanus but believes it was many years ago.  Will update the patient's tetanus in the emergency department.  X-ray does not appear to show any obvious fracture.  Will cover with antibiotics as a precaution otherwise discussed supportive care including icing the area for 20 to 30 minutes every 2 hours.  Patient agreeable to plan.  FINAL CLINICAL IMPRESSION(S) / ED DIAGNOSES   Right hand pain and swelling   Note:  This  document was prepared using Conservation officer, historic buildings and may include unintentional dictation errors.   Minna Antis, MD 07/17/23 1100

## 2023-07-17 NOTE — ED Triage Notes (Signed)
Pt presents with R hand swelling/abrasions and pt concerned for fracture. Pt reports Friday night he and his friends were having a contest to see who could break wooden boards.

## 2023-12-07 ENCOUNTER — Encounter: Payer: Self-pay | Admitting: Emergency Medicine

## 2023-12-07 ENCOUNTER — Other Ambulatory Visit: Payer: Self-pay

## 2023-12-07 ENCOUNTER — Emergency Department: Payer: Self-pay

## 2023-12-07 ENCOUNTER — Emergency Department
Admission: EM | Admit: 2023-12-07 | Discharge: 2023-12-08 | Disposition: A | Discharge status: Home / Self Care | Payer: Self-pay | Attending: Emergency Medicine | Admitting: Emergency Medicine

## 2023-12-07 DIAGNOSIS — Z951 Presence of aortocoronary bypass graft: Secondary | ICD-10-CM | POA: Insufficient documentation

## 2023-12-07 DIAGNOSIS — W19XXXA Unspecified fall, initial encounter: Secondary | ICD-10-CM | POA: Insufficient documentation

## 2023-12-07 DIAGNOSIS — S0081XA Abrasion of other part of head, initial encounter: Secondary | ICD-10-CM | POA: Insufficient documentation

## 2023-12-07 DIAGNOSIS — T679XXA Effect of heat and light, unspecified, initial encounter: Secondary | ICD-10-CM | POA: Insufficient documentation

## 2023-12-07 DIAGNOSIS — S060XAA Concussion with loss of consciousness status unknown, initial encounter: Secondary | ICD-10-CM | POA: Insufficient documentation

## 2023-12-07 LAB — BASIC METABOLIC PANEL WITH GFR
Anion gap: 15 (ref 5–15)
BUN: 19 mg/dL (ref 6–20)
CO2: 21 mmol/L — ABNORMAL LOW (ref 22–32)
Calcium: 9.8 mg/dL (ref 8.9–10.3)
Chloride: 100 mmol/L (ref 98–111)
Creatinine, Ser: 1.07 mg/dL (ref 0.61–1.24)
GFR, Estimated: >60 mL/min (ref >60)
Glucose, Bld: 268 mg/dL — ABNORMAL HIGH (ref 70–99)
Potassium: 3.6 mmol/L (ref 3.5–5.1)
Sodium: 136 mmol/L (ref 135–145)

## 2023-12-07 LAB — TROPONIN I (HIGH SENSITIVITY): Troponin I (High Sensitivity): 10 ng/L (ref <18)

## 2023-12-07 LAB — CBC
HCT: 48.5 % (ref 39.0–52.0)
Hemoglobin: 17.3 g/dL — ABNORMAL HIGH (ref 13.0–17.0)
MCH: 29.1 pg (ref 26.0–34.0)
MCHC: 35.7 g/dL (ref 30.0–36.0)
MCV: 81.5 fL (ref 80.0–100.0)
Platelets: 214 10*3/uL (ref 150–400)
RBC: 5.95 MIL/uL — ABNORMAL HIGH (ref 4.22–5.81)
RDW: 12.5 % (ref 11.5–15.5)
WBC: 8.4 10*3/uL (ref 4.0–10.5)
nRBC: 0 % (ref 0.0–0.2)

## 2023-12-07 LAB — CK: Total CK: 159 U/L (ref 49–397)

## 2023-12-07 MED ORDER — LACTATED RINGERS IV BOLUS
1000.0000 mL | Freq: Once | INTRAVENOUS | Status: AC
  Administered 2023-12-08: 1000 mL via INTRAVENOUS

## 2023-12-07 NOTE — ED Provider Notes (Signed)
 [facility] Provider Note    Event Date/Time   First MD Initiated Contact with Patient 12/07/23 2313     (approximate)   History   Near Syncope   HPI  Scott [name] is a 53 y.o. male who presents to the ED for evaluation of Near Syncope   Review a [facility] CT surgery clinic visit from 2023.  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  Patient presents to the ED for evaluation of dizziness, vertigo, unsteadiness, weakness over the past 7-8 hours.  This afternoon he was outside in the garden using a tiller in the sun when he began feeling dizzy, unsteady and fell to the ground.  Struck his forehead on something, isn't sure exactly what, the tiller or fencing possibly.    He reports room spinning sensation and vertigo primarily, with some lightheadedness with position change.  Reports he has not been able to ambulate effectively over the past few hours due to this, his dizziness is improved somewhat but still has persistent symptoms.    Physical Exam   Triage Vital Signs: ED Triage Vitals [12/07/23 1937]  Encounter Vitals Group     BP (!) 159/105     Systolic BP Percentile      Diastolic BP Percentile      Pulse Rate 100     Resp 18     Temp 98.1 F (36.7 C)     Temp Source Oral     SpO2 100 %     Weight      Height      Head Circumference      Peak Flow      Pain Score 8     Pain Loc      Pain Education      Exclude from Growth Chart     Most recent vital signs: Vitals:   12/08/23 0049 12/08/23 0345  BP: (!) 146/102 (!) 178/109  Pulse: 71 67  Resp: 15   Temp: 97.7 F (36.5 C)   SpO2: 98% 91%    General: Awake, no distress.  Seems uncomfortable, keeping his eyes closed.  No nystagmus CV:  Good peripheral perfusion.  Resp:  Normal effort.  Abd:  No distention.  MSK:  No deformity noted.  Neuro:  No focal deficits appreciated.  With arm raise and leg raise he has most mild drift on the left side that is suspicious for mild weakness.  Grip strength  is symmetric bilaterally.  Sensation intact throughout.  Cranial nerves intact. Other:  Small superficial abrasion to the forehead, no laceration to require repair.    ED Results / Procedures / Treatments   Labs (all labs ordered are listed, but only abnormal results are displayed) Labs Reviewed  BASIC METABOLIC PANEL WITH GFR - Abnormal; Notable for the following components:      Result Value   CO2 21 (*)    Glucose, Bld 268 (*)    All other components within normal limits  CBC - Abnormal; Notable for the following components:   RBC 5.95 (*)    Hemoglobin 17.3 (*)    All other components within normal limits  TROPONIN I (HIGH SENSITIVITY) - Abnormal; Notable for the following components:   Troponin I (High Sensitivity) 22 (*)    All other components within normal limits  TROPONIN I (HIGH SENSITIVITY) - Abnormal; Notable for the following components:   Troponin I (High Sensitivity) 18 (*)    All other components within normal limits  CK  TROPONIN I (HIGH SENSITIVITY)    EKG Sinus rhythm with a rate of 97 bpm.  Normal axis and intervals.  No clear signs of acute ischemia.  RADIOLOGY CXR interpreted by me without evidence of acute cardiopulmonary pathology. CT head interpreted by me without evidence of acute intracranial pathology  Official radiology report(s): MR BRAIN WO CONTRAST Result Date: 12/08/2023 CLINICAL DATA:  Pre syncopal episode. Fall. Left-sided weakness. Ataxia. EXAM: MRI HEAD WITHOUT CONTRAST TECHNIQUE: Multiplanar, multiecho pulse sequences of the brain and surrounding structures were obtained without intravenous contrast. COMPARISON:  CT angio head and neck in noncontrast head CT 12/08/2023. CT head without contrast 07/25/2022. FINDINGS: Brain: No acute infarct, hemorrhage, or mass lesion is present. No significant white matter lesions are present. The ventricles are of normal size. No significant extraaxial fluid collection is present. Deep brain nuclei are  within normal limits. The brainstem and cerebellum are within normal limits. Midline structures are within normal limits. Vascular: Flow is present in the major intracranial arteries. Skull and upper cervical spine: The craniocervical junction is normal. Upper cervical spine is within normal limits. Marrow signal is unremarkable. Sinuses/Orbits: A polyp or mucous retention cyst is present in the left maxillary sinus. The paranasal sinuses and mastoid air cells are otherwise clear. The globes and orbits are within normal limits. IMPRESSION: Normal MRI appearance of the brain. No acute or focal lesion to explain the patient's symptoms. Electronically Signed   By: Audree Leas M.D.   On: 12/08/2023 04:12   CT ANGIO HEAD NECK W WO CM Result Date: 12/08/2023 CLINICAL DATA:  Initial evaluation for acute dizziness. EXAM: CT ANGIOGRAPHY HEAD AND NECK WITH AND WITHOUT CONTRAST TECHNIQUE: Multidetector CT imaging of the head and neck was performed using the standard protocol during bolus administration of intravenous contrast. Multiplanar CT image reconstructions and MIPs were obtained to evaluate the vascular anatomy. Carotid stenosis measurements (when applicable) are obtained utilizing NASCET criteria, using the distal internal carotid diameter as the denominator. RADIATION DOSE REDUCTION: This exam was performed according to the departmental dose-optimization program which includes automated exposure control, adjustment of the mA and/or kV according to patient size and/or use of iterative reconstruction technique. CONTRAST:  75mL OMNIPAQUE IOHEXOL 350 MG/ML SOLN COMPARISON:  Prior study from 07/25/2022. FINDINGS: CT HEAD FINDINGS Brain: Cerebral volume within normal limits for patient age. No acute intracranial hemorrhage. No acute large vessel territory infarct. No mass lesion, midline shift, or mass effect. Ventricles are normal in size without hydrocephalus. No extra-axial fluid collection. Vascular: No  abnormal hyperdense vessel. Skull: Scalp soft tissues demonstrate no acute abnormality. Calvarium intact. Sinuses/Orbits: Globes and orbital soft tissues within normal limits. Visualized paranasal sinuses are largely clear. No significant mastoid effusion. CTA NECK FINDINGS Aortic arch: Standard branching. Imaged portion shows no evidence of aneurysm or dissection. No significant stenosis of the major arch vessel origins. Right carotid system: No evidence of dissection, stenosis (50% or greater), or occlusion. Left carotid system: No evidence of dissection, stenosis (50% or greater), or occlusion. Vertebral arteries: No evidence of dissection, stenosis (50% or greater), or occlusion. Skeleton: No worrisome osseous lesions. Moderate spondylosis present at C5-6 through C7-T1. Prior sternotomy. Poor dentition noted. Other neck: No other acute finding. 2 cm probable sebaceous cyst noted within the subcutaneous fat of the right upper back (series 9, image 126). Upper chest: No other acute finding. Review of the MIP images confirms the above findings CTA HEAD FINDINGS Anterior circulation: Both internal carotid arteries widely patent to the termini without stenosis.  A1 segments widely patent. Normal anterior communicating artery complex. Both anterior cerebral arteries widely patent to their distal aspects without stenosis. No M1 stenosis or occlusion. Normal MCA bifurcations. Distal MCA branches well perfused and symmetric. Posterior circulation: Both V4 segments patent to the vertebrobasilar junction without stenosis. Both PICA origins patent and normal. Basilar widely patent to its distal aspect without stenosis. Superior cerebellar arteries patent bilaterally. Both PCAs primarily supplied via the basilar and are well perfused to there distal aspects. Venous sinuses: Patent allowing for timing the contrast bolus. Anatomic variants: None significant.  No aneurysm. Review of the MIP images confirms the above findings  IMPRESSION: 1. Normal CTA of the head and neck. No large vessel occlusion, hemodynamically significant stenosis, or other acute vascular abnormality. 2. No other acute intracranial abnormality. Electronically Signed   By: Virgia Griffins M.D.   On: 12/08/2023 00:52   DG Chest 2 View Result Date: 12/07/2023 CLINICAL DATA:  Presyncope. EXAM: CHEST - 2 VIEW COMPARISON:  07/25/2022 FINDINGS: Stable cardiomediastinal silhouette. Sternotomy. No focal consolidation, pleural effusion, or pneumothorax. No displaced rib fractures. IMPRESSION: No active cardiopulmonary disease. Electronically Signed   By: Rozell Cornet M.D.   On: 12/07/2023 20:12    PROCEDURES and INTERVENTIONS:  .1-3 Lead EKG Interpretation  Performed by: Arline Bennett, MD Authorized by: Arline Bennett, MD     Interpretation: normal     ECG rate:  95   ECG rate assessment: normal     Rhythm: sinus rhythm     Ectopy: none     Conduction: normal   .Ultrasound ED Peripheral IV (Provider)  Date/Time: 12/08/2023 12:13 AM  Performed by: Arline Bennett, MD Authorized by: Arline Bennett, MD   Procedure details:    Indications: multiple failed IV attempts and poor IV access     Skin Prep: chlorhexidine gluconate     Location: left basilic v.   Angiocath:  20 G   Bedside Ultrasound Guided: Yes     Images: not archived     Patient tolerated procedure without complications: Yes     Dressing applied: Yes     Medications  lactated ringers  bolus 1,000 mL (0 mLs Intravenous Stopped 12/08/23 0215)  iohexol (OMNIPAQUE) 350 MG/ML injection 75 mL (75 mLs Intravenous Contrast Given 12/08/23 0016)     IMPRESSION / MDM / ASSESSMENT AND PLAN / ED COURSE  I reviewed the triage vital signs and the nursing notes.  Differential diagnosis includes, but is not limited to, stroke, ICH, TIA, carotid stenosis, dehydration, rhabdomyolysis, AKI  {Patient presents with symptoms of an acute illness or injury that is potentially  life-threatening.  Patient presents to the ED with dizziness after a fall and head injury while working outside today.  Most suspicious for concussion from his head trauma.   I question a small amount of drift on the left side while examining his extremities , this is inconsistent on exam.  Abrasion to the forehead but no other signs of trauma.   Blood work is generally benign without signs of rhabdomyolysis, renal dysfunction or significant hematologic dysfunction.  Hyperglycemia is noted but no acidosis.  Troponins are flat.   CTA neck/head obtained without signs of ICH, carotid stenosis or other source of intracranial ischemia.  Due to his continued unsteady gait we obtained an MRI of his brain that does not show any signs of CVA.   I certainly considered admission for this patient and offered to him admission multiple times during my evaluations but he declines and has  capacity.  He prefer to go home with his wife.  We discussed close follow-up, return precautions  Clinical Course as of 12/08/23 0546  Thu Dec 08, 2023  0001 USIV, 20g left basilic v [DS]  4098 Reassessed.  Waking up.  Reports feeling okay.  Thinks he is feeling better and wants to get up and ambulate around to help determine if we need to MRI brain. [DS]  0242 Quite unsteady on his feet still.  I recommend MRI brain. [DS]  1191 Reassessed and discussed reassuring workup and possible etiologies of his symptoms.  I offered him observation medical admission but he declines indicating he just wants to go home.  We discussed concussions after striking his head today, [DS]    Clinical Course User Index [DS] Arline Bennett, MD     FINAL CLINICAL IMPRESSION(S) / ED DIAGNOSES   Final diagnoses:  Fall, initial encounter  Concussion with unknown loss of consciousness status, initial encounter  Heat exposure, initial encounter     Rx / DC Orders   ED Discharge Orders     None        Note:  This document was prepared  using Dragon voice recognition software and may include unintentional dictation errors.   Arline Bennett, MD 12/08/23 [phone]

## 2023-12-07 NOTE — ED Triage Notes (Signed)
 Pt reports tilling in his garden and got hot  Felt like he would pass out. Did not lose consciousness but did fall. No obvious injury.  Has been out of diabetic and hypertension medications for a while

## 2023-12-08 ENCOUNTER — Emergency Department: Payer: Self-pay

## 2023-12-08 LAB — TROPONIN I (HIGH SENSITIVITY)
Troponin I (High Sensitivity): 22 ng/L — ABNORMAL HIGH (ref <18)
Troponin I (High Sensitivity): 18 ng/L — ABNORMAL HIGH (ref <18)

## 2023-12-08 MED ORDER — IOHEXOL 350 MG/ML SOLN
75.0000 mL | Freq: Once | INTRAVENOUS | Status: AC | PRN
  Administered 2023-12-08: 75 mL via INTRAVENOUS

## 2023-12-08 NOTE — ED Notes (Signed)
 Fall bundle in place. Pt is wearing crocs therefor no socks have been put on the pt at this time.

## 2023-12-08 NOTE — ED Notes (Signed)
 Pt placed on 2L [place] while sleeping due to oxygen dropping into the 80s when sleeping.

## 2023-12-08 NOTE — ED Notes (Addendum)
 This RN attempted to ambulate pt. Pt stumbled on multiple occasions. After the third stumble pt was returned to bed. Pt endorse dizziness and weakness. EDP notified. MRI was ordered by EDP

## 2024-04-17 ENCOUNTER — Emergency Department: Payer: Self-pay

## 2024-04-17 ENCOUNTER — Encounter: Payer: Self-pay | Admitting: Emergency Medicine

## 2024-04-17 ENCOUNTER — Emergency Department
Admission: EM | Admit: 2024-04-17 | Discharge: 2024-04-17 | Disposition: A | Discharge status: Home / Self Care | Payer: Self-pay | Attending: Emergency Medicine | Admitting: Emergency Medicine

## 2024-04-17 ENCOUNTER — Other Ambulatory Visit: Payer: Self-pay

## 2024-04-17 DIAGNOSIS — J168 Pneumonia due to other specified infectious organisms: Secondary | ICD-10-CM | POA: Insufficient documentation

## 2024-04-17 DIAGNOSIS — R7989 Other specified abnormal findings of blood chemistry: Secondary | ICD-10-CM | POA: Insufficient documentation

## 2024-04-17 DIAGNOSIS — I10 Essential (primary) hypertension: Secondary | ICD-10-CM | POA: Insufficient documentation

## 2024-04-17 DIAGNOSIS — D72829 Elevated white blood cell count, unspecified: Secondary | ICD-10-CM | POA: Insufficient documentation

## 2024-04-17 DIAGNOSIS — J189 Pneumonia, unspecified organism: Secondary | ICD-10-CM

## 2024-04-17 LAB — CBC WITH DIFFERENTIAL/PLATELET
Abs Immature Granulocytes: 0.05 K/uL (ref 0.00–0.07)
Basophils Absolute: 0 K/uL (ref 0.0–0.1)
Basophils Relative: 0 %
Eosinophils Absolute: 0.1 K/uL (ref 0.0–0.5)
Eosinophils Relative: 1 %
HCT: 47.3 % (ref 39.0–52.0)
Hemoglobin: 16.5 g/dL (ref 13.0–17.0)
Immature Granulocytes: 0 %
Lymphocytes Relative: 14 %
Lymphs Abs: 1.7 K/uL (ref 0.7–4.0)
MCH: 28.2 pg (ref 26.0–34.0)
MCHC: 34.9 g/dL (ref 30.0–36.0)
MCV: 80.7 fL (ref 80.0–100.0)
Monocytes Absolute: 0.7 K/uL (ref 0.1–1.0)
Monocytes Relative: 6 %
Neutro Abs: 9.5 K/uL — ABNORMAL HIGH (ref 1.7–7.7)
Neutrophils Relative %: 79 %
Platelets: 222 K/uL (ref 150–400)
RBC: 5.86 MIL/uL — ABNORMAL HIGH (ref 4.22–5.81)
RDW: 12.2 % (ref 11.5–15.5)
WBC: 12.1 K/uL — ABNORMAL HIGH (ref 4.0–10.5)
nRBC: 0 % (ref 0.0–0.2)

## 2024-04-17 LAB — LACTIC ACID, PLASMA
Lactic Acid, Venous: 3.6 mmol/L (ref 0.5–1.9)
Lactic Acid, Venous: 2 mmol/L (ref 0.5–1.9)

## 2024-04-17 LAB — BLOOD GAS, VENOUS
Acid-Base Excess: 3.4 mmol/L — ABNORMAL HIGH (ref 0.0–2.0)
Bicarbonate: 26.8 mmol/L (ref 20.0–28.0)
O2 Saturation: 92 %
Patient temperature: 37
pCO2, Ven: 36 mmHg — ABNORMAL LOW (ref 44–60)
pH, Ven: 7.48 — ABNORMAL HIGH (ref 7.25–7.43)
pO2, Ven: 59 mmHg — ABNORMAL HIGH (ref 32–45)

## 2024-04-17 LAB — RESP PANEL BY RT-PCR (RSV, FLU A&B, COVID)  RVPGX2
Influenza A by PCR: NEGATIVE
Influenza B by PCR: NEGATIVE
Resp Syncytial Virus by PCR: NEGATIVE
SARS Coronavirus 2 by RT PCR: NEGATIVE

## 2024-04-17 LAB — COMPREHENSIVE METABOLIC PANEL WITH GFR
ALT: 22 U/L (ref 0–44)
AST: 25 U/L (ref 15–41)
Albumin: 3.7 g/dL (ref 3.5–5.0)
Alkaline Phosphatase: 100 U/L (ref 38–126)
Anion gap: 10 (ref 5–15)
BUN: 12 mg/dL (ref 6–20)
CO2: 27 mmol/L (ref 22–32)
Calcium: 8.9 mg/dL (ref 8.9–10.3)
Chloride: 99 mmol/L (ref 98–111)
Creatinine, Ser: 1.08 mg/dL (ref 0.61–1.24)
GFR, Estimated: >60 mL/min (ref >60)
Glucose, Bld: 378 mg/dL — ABNORMAL HIGH (ref 70–99)
Potassium: 4 mmol/L (ref 3.5–5.1)
Sodium: 136 mmol/L (ref 135–145)
Total Bilirubin: 0.8 mg/dL (ref 0.0–1.2)
Total Protein: 7.7 g/dL (ref 6.5–8.1)

## 2024-04-17 LAB — TROPONIN I (HIGH SENSITIVITY)
Troponin I (High Sensitivity): 6 ng/L (ref <18)
Troponin I (High Sensitivity): 6 ng/L (ref <18)

## 2024-04-17 LAB — D-DIMER, QUANTITATIVE: D-Dimer, Quant: 0.29 ug{FEU}/mL (ref 0.00–0.50)

## 2024-04-17 LAB — BRAIN NATRIURETIC PEPTIDE: B Natriuretic Peptide: 17.2 pg/mL (ref 0.0–100.0)

## 2024-04-17 MED ORDER — BENZONATATE 100 MG PO CAPS: 100.0000 mg | ORAL_CAPSULE | Freq: Three times a day (TID) | ORAL | 0 refills | Status: AC | PRN

## 2024-04-17 MED ORDER — SODIUM CHLORIDE 0.9 % IV BOLUS
1000.0000 mL | Freq: Once | INTRAVENOUS | Status: AC
  Administered 2024-04-17: 1000 mL via INTRAVENOUS

## 2024-04-17 MED ORDER — CEFPODOXIME PROXETIL 200 MG PO TABS: 200.0000 mg | ORAL_TABLET | Freq: Two times a day (BID) | ORAL | 0 refills | Status: DC

## 2024-04-17 MED ORDER — DOXYCYCLINE HYCLATE 100 MG PO TABS: 100.0000 mg | ORAL_TABLET | Freq: Two times a day (BID) | ORAL | 0 refills | Status: DC

## 2024-04-17 MED ORDER — DOXYCYCLINE HYCLATE 100 MG PO TABS
100.0000 mg | ORAL_TABLET | Freq: Once | ORAL | Status: AC
  Administered 2024-04-17: 100 mg via ORAL
  Filled 2024-04-17: qty 1

## 2024-04-17 MED ORDER — BENZONATATE 100 MG PO CAPS: 100.0000 mg | ORAL_CAPSULE | Freq: Three times a day (TID) | ORAL | 0 refills | Status: DC | PRN

## 2024-04-17 MED ORDER — CEFPODOXIME PROXETIL 200 MG PO TABS: 200.0000 mg | ORAL_TABLET | Freq: Two times a day (BID) | ORAL | 0 refills | Status: AC

## 2024-04-17 MED ORDER — SODIUM CHLORIDE 0.9 % IV SOLN
2.0000 g | Freq: Once | INTRAVENOUS | Status: AC
  Administered 2024-04-17: 2 g via INTRAVENOUS
  Filled 2024-04-17: qty 20

## 2024-04-17 MED ORDER — ONDANSETRON HCL 4 MG/2ML IJ SOLN
4.0000 mg | Freq: Once | INTRAMUSCULAR | Status: AC
  Administered 2024-04-17: 4 mg via INTRAVENOUS
  Filled 2024-04-17: qty 2

## 2024-04-17 MED ORDER — DOXYCYCLINE HYCLATE 100 MG PO TABS: 100.0000 mg | ORAL_TABLET | Freq: Two times a day (BID) | ORAL | 0 refills | Status: AC

## 2024-04-17 MED ORDER — MORPHINE SULFATE (PF) 4 MG/ML IV SOLN
6.0000 mg | Freq: Once | INTRAVENOUS | Status: AC
  Administered 2024-04-17: 6 mg via INTRAVENOUS
  Filled 2024-04-17: qty 2

## 2024-04-17 NOTE — ED Triage Notes (Signed)
 Patient to ED via POV for cough and congestion. States ongoing for a little over 1 week. States he vomited up fleam this AM after coughing. PT noted to be breathing hard in triage. Hx HTN- been out of medication x3 months. States pressure in chest from the congestion- denies pain in chest.

## 2024-04-17 NOTE — ED Notes (Signed)
 Pt taken to XR.

## 2024-04-17 NOTE — Discharge Instructions (Addendum)
 We are starting you on antibiotics to treat a possible pneumonia given your ongoing symptoms with elevation of some of your infectious markers.  Your x-ray was reassuring and there was no evidence of blood clots.  You should return to the ER if you develop worsening shortness of breath, fevers or any other concerns.  Is important that you restart your medications for your diabetes and your blood pressure as these can have long-term bad consequences.

## 2024-04-17 NOTE — ED Notes (Signed)
 Pt. returned from XR.

## 2024-04-17 NOTE — ED Provider Notes (Addendum)
 [facility] Provider Note    Event Date/Time   First MD Initiated Contact with Patient 04/17/24 0825     (approximate)   History   Cough   HPI  Scott [name] is a 53 y.o. male with history of hypertension comes in with cough and congestion for over a week.  Patient reports being out of his blood pressure medications for about 3 months.  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  He reports that he is not been taking any of his medications over the past year.  He supposed to be on a baby aspirin  but states he has not taken it.  He reports for the past 1 week he has had a cough.  He reports that since this morning he has coughed so much that he is then vomited up phlegm.  He reports a pressure sensation in his chest secondary to all of the coughing and some facial congestion.  He denies ever having his COVID vaccines.  He denies any abdominal pain.  He reports the chest pressure has been there for about a week.  He denies any daily alcohol use, IV drug use, smoking history.  He does report that his right leg has not had a little bit of swelling noted in it intermittently but denies any currently.  Denies any other factors for pulmonary embolism.   Physical Exam   Triage Vital Signs: ED Triage Vitals  Encounter Vitals Group     BP 04/17/24 0812 (!) 203/118     Girls Systolic BP Percentile --      Girls Diastolic BP Percentile --      Boys Systolic BP Percentile --      Boys Diastolic BP Percentile --      Pulse Rate 04/17/24 0812 (!) 125     Resp 04/17/24 0812 (!) 26     Temp 04/17/24 0812 98.4 F (36.9 C)     Temp Source 04/17/24 0812 Oral     SpO2 04/17/24 0812 97 %     Weight 04/17/24 0814 220 lb (99.8 kg)     Height 04/17/24 0814 5' 10 (1.778 m)     Head Circumference --      Peak Flow --      Pain Score 04/17/24 0813 2     Pain Loc --      Pain Education --      Exclude from Growth Chart --     Most recent vital  signs: Vitals:   04/17/24 0812  BP: (!) 203/118  Pulse: (!) 125  Resp: (!) 26  Temp: 98.4 F (36.9 C)  SpO2: 97%     General: Awake, no distress.  CV:  Good peripheral perfusion. No murmur Resp:  Increased work of breathing.  No wheezing Abd:  No distention.  Soft and nom tender Other:  No swelling in legs.  No calf tenderness Equal pulses, no numbness  ED Results / Procedures / Treatments   Labs (all labs ordered are listed, but only abnormal results are displayed) Labs Reviewed  COMPREHENSIVE METABOLIC PANEL WITH GFR - Abnormal; Notable for the following components:      Result Value   Glucose, Bld 378 (*)    All other components within normal limits  CBC WITH DIFFERENTIAL/PLATELET - Abnormal; Notable for the following components:   WBC 12.1 (*)    RBC 5.86 (*)    Neutro Abs 9.5 (*)    All other components  within normal limits  BLOOD GAS, VENOUS - Abnormal; Notable for the following components:   pH, Ven 7.48 (*)    pCO2, Ven 36 (*)    pO2, Ven 59 (*)    Acid-Base Excess 3.4 (*)    All other components within normal limits  LACTIC ACID, PLASMA - Abnormal; Notable for the following components:   Lactic Acid, Venous 3.6 (*)    All other components within normal limits  RESP PANEL BY RT-PCR (RSV, FLU A&B, COVID)  RVPGX2  BRAIN NATRIURETIC PEPTIDE  URINALYSIS, W/ REFLEX TO CULTURE (INFECTION SUSPECTED)  LACTIC ACID, PLASMA  D-DIMER, QUANTITATIVE  TROPONIN I (HIGH SENSITIVITY)  TROPONIN I (HIGH SENSITIVITY)     EKG  My interpretation of EKG:  Sinus tachycardia rate of 120 without any ST elevation or T wave inversions, normal intervals  RADIOLOGY I have reviewed the xray personally and interpreted no evidence of any pneumonia   PROCEDURES:  Critical Care performed: No  .1-3 Lead EKG Interpretation  Performed by: Ernest Ronal BRAVO, MD Authorized by: Ernest Ronal BRAVO, MD     Interpretation: abnormal     ECG rate:  120   ECG rate assessment: tachycardic      Rhythm: sinus tachycardia     Ectopy: none     Conduction: normal      MEDICATIONS ORDERED IN ED: Medications  sodium chloride  0.9 % bolus 1,000 mL (has no administration in time range)  morphine (PF) 4 MG/ML injection 6 mg (6 mg Intravenous Given 04/17/24 0915)  ondansetron  (ZOFRAN ) injection 4 mg (4 mg Intravenous Given 04/17/24 0916)  sodium chloride  0.9 % bolus 1,000 mL (1,000 mLs Intravenous New Bag/Given 04/17/24 0935)     IMPRESSION / MDM / ASSESSMENT AND PLAN / ED COURSE  I reviewed the triage vital signs and the nursing notes.   Patient's presentation is most consistent with acute presentation with potential threat to life or bodily function.   Patient comes in tachycardic, hypertensive not compliant with medications with cough, chest pressure.     INITIAL IMPRESSION / ASSESSMENT AND PLAN / ED COURSE   Most Likely DDx:  -Consider ACS vs MSK Vs Thalia- will get EKG/troponin to evaluate for ACS -PNA given the cough will get xray   DDx that was also considered d/t potential to cause harm, but was found less likely based on history and physical (as detailed above): -PNX (reassured with equal b/l breath sounds, CXR to evaluate) -Symptomatic anemia (will get H&H) -Pulmonary embolism as no sob at rest, not pleuritic in nature, no hypoxia -Aortic Dissection as no tearing pain and no radiation to the mid back, pulses equal, no murmur -Pericarditis no EKG changes or hx to suggest dx -Tamponade (no notable SOB, tachycardic, hypotensive) -Esophageal rupture (no h/o diffuse vomitting/no crepitus)    Will give patient some IV morphine IV Zofran  to help with symptoms.   BNP is reassuring VBG without evidence of hyper And.  Initial troponin was negative we will get a repeat.  Sugars are elevated in the 300s but no evidence of DKA.  White count is elevated at 12 with left shift.  Lactate is elevated at 3.6.  Will get a repeat after some fluids.  His COVID test was negative.   Given he did report a little bit of intermittent right leg swelling we discussed ultrasound to rule out DVT I do not see any obvious signs of DVT on examination he denies any other risk factors for PE.  Given x-ray without  evidence of pneumonia we discussed CT imaging to evaluate for pneumonia versus PE.  Patient states he does not have insurance and did not want to have to do a CT unless necessary.  We discussed doing a D-dimer to screen for pulmonary embolism although can sometimes be falsely elevated.  His heart rates have come down from 124 down to normal so I suspect his tachycardia was more likely related to infection.  He is oxygen levels are reassuring.  He would prefer to start off with D-dimer prior to doing CT imaging.   Lactate is dowtending to 2 discussed doing antoer lactate now that he is got additional liter of fluid but he prefer to go home which I think is reasonable.  He does not appear septic and vital signs have normalized after fluids.  He is resting comfortably in bed we discussed admission to the hospital versus going home and he strongly preferred discharge with any is reasonable given no hypoxia is no evidence of ACS we discussed his elevated sugars and he states that he does not take his medications.  He knows that he needs to follow-up with his primary care doctor to be restarted on his blood pressure and diabetes medicine.  He expressed understanding.  We discussed starting patient on 2 antibiotics to cover for potential pneumonia that I suspect we would see on his CT scan but of opted not to do a CT PE given D-dimer was negative and low suspicion for PE as well as ultrasound being negative for DVT.  Pt delcined UA denies UTI  Note:  This document was prepared using Dragon voice recognition software and may include unintentional dictation errors.   The patient is on the cardiac monitor to evaluate for evidence of arrhythmia and/or significant heart rate changes.       FINAL CLINICAL IMPRESSION(S) / ED DIAGNOSES   Final diagnoses:  Pneumonia due to infectious organism, unspecified laterality, unspecified part of lung     Rx / DC Orders   ED Discharge Orders          Ordered    doxycycline (VIBRA-TABS) 100 MG tablet  2 times daily        04/17/24 1153    cefpodoxime (VANTIN) 200 MG tablet  2 times daily        04/17/24 1153    benzonatate  (TESSALON  PERLES) 100 MG capsule  3 times daily PRN        04/17/24 1153             Note:  This document was prepared using Dragon voice recognition software and may include unintentional dictation errors.   Ernest Ronal BRAVO, MD 04/17/24 1154    Ernest Ronal BRAVO, MD 04/17/24 [phone]

## 2024-04-17 NOTE — ED Notes (Signed)
 Pt denies wanting anything for pain at this time, pt informed to let this RN know if he changes his mind.
